# Patient Record
Sex: Male | Born: 1985
Health system: Southern US, Community
[De-identification: ages and names within clinical notes are randomized; demographics above are authoritative.]

## PROBLEM LIST (undated history)

## (undated) HISTORY — PX: WISDOM TOOTH EXTRACTION: SHX21

---

## 2010-04-19 ENCOUNTER — Emergency Department (HOSPITAL_COMMUNITY): Admission: EM | Admit: 2010-04-19 | Discharge: 2010-04-19 | Payer: Self-pay | Admitting: Family Medicine

## 2010-04-26 ENCOUNTER — Emergency Department (HOSPITAL_COMMUNITY): Admission: EM | Admit: 2010-04-26 | Discharge: 2010-04-26 | Payer: Self-pay | Admitting: Family Medicine

## 2011-04-03 ENCOUNTER — Inpatient Hospital Stay (HOSPITAL_COMMUNITY)
Admission: RE | Admit: 2011-04-03 | Discharge: 2011-04-03 | Disposition: A | Discharge status: Home / Self Care | Payer: Self-pay | Source: Ambulatory Visit | Attending: Family Medicine | Admitting: Family Medicine

## 2011-06-20 ENCOUNTER — Inpatient Hospital Stay (INDEPENDENT_AMBULATORY_CARE_PROVIDER_SITE_OTHER)
Admission: RE | Admit: 2011-06-20 | Discharge: 2011-06-20 | Disposition: A | Discharge status: Home / Self Care | Payer: Self-pay | Source: Ambulatory Visit | Attending: Emergency Medicine | Admitting: Emergency Medicine

## 2011-06-20 DIAGNOSIS — R112 Nausea with vomiting, unspecified: Secondary | ICD-10-CM

## 2012-03-01 ENCOUNTER — Encounter (HOSPITAL_COMMUNITY): Payer: Self-pay | Admitting: Emergency Medicine

## 2012-03-01 ENCOUNTER — Emergency Department (HOSPITAL_COMMUNITY)
Admission: EM | Admit: 2012-03-01 | Discharge: 2012-03-01 | Disposition: A | Discharge status: Home / Self Care | Payer: BC Managed Care – PPO | Attending: Emergency Medicine | Admitting: Emergency Medicine

## 2012-03-01 DIAGNOSIS — F17200 Nicotine dependence, unspecified, uncomplicated: Secondary | ICD-10-CM | POA: Insufficient documentation

## 2012-03-01 DIAGNOSIS — M545 Low back pain, unspecified: Secondary | ICD-10-CM | POA: Insufficient documentation

## 2012-03-01 DIAGNOSIS — M533 Sacrococcygeal disorders, not elsewhere classified: Secondary | ICD-10-CM | POA: Insufficient documentation

## 2012-03-01 NOTE — ED Provider Notes (Signed)
History   This chart was scribed for Hurman Horn, MD by Melba Coon. The patient was seen in room APA09/APA09 and the patient's care was started at 7:18AM.    CSN: [id number]  Arrival date & time 03/01/12  4098   None     Chief Complaint  Patient presents with  . Back Pain    (Consider location/radiation/quality/duration/timing/severity/associated sxs/prior treatment) HPI Jose Zuniga is a 26 y.o. male who presents to the Emergency Department complaining of constant, dull, non-radiating, moderate to severe lower back pain with an onset this morning. Pt woke up this morning with a huge "egg-shaped" lump; pt states that the pain was so severe that "it was taking the breath out of me". Pt was fine yesterday night. Pt was non-ambulatory PTA but now walking and pain much better. 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 No weakness or numbness. No HA, Cp, neck pain, abd pain, n/v/d, or extremity pain or edema. No family hx of inflammotroty arthritis. No Hx of abscesses. No known allergies. No other pertinent medical problems. Pt is a non-smoker.No IVDA.  History reviewed. No pertinent past medical history.  Past Surgical History  Procedure Date  . Wisdom tooth extraction     No family history on file.  History  Substance Use Topics  . Smoking status: Current Everyday Smoker -- 1.0 packs/day for 5 years    Types: Cigarettes  . Smokeless tobacco: Not on file  . Alcohol Use: Yes     occasional      Review of Systems 10 Systems reviewed and are negative for acute change except as noted in the HPI.  Allergies  Review of patient's allergies indicates no known allergies.  Home Medications   Current Outpatient Rx  Name Route Sig Dispense Refill  . BUPRENORPHINE HCL-NALOXONE HCL 8-2 MG SL SUBL Sublingual Place under the tongue.      BP 117/71  Pulse 74  Temp(Src) 98.3 F (36.8 C) (Oral)  Ht 6\' 4"  (1.93  m)  Wt 179 lb (81.194 kg)  BMI 21.79 kg/m2  SpO2 100%  Physical Exam  Nursing note and vitals reviewed. Constitutional: He appears well-developed and well-nourished. No distress.       Awake, alert, nontoxic appearance with baseline speech.  HENT:  Head: Normocephalic and atraumatic.  Eyes: Conjunctivae and EOM are normal. Pupils are equal, round, and reactive to light. Right eye exhibits no discharge. Left eye exhibits no discharge.  Neck: Normal range of motion. Neck supple.  Cardiovascular: Normal rate, regular rhythm and normal heart sounds.   No murmur heard. Pulmonary/Chest: Effort normal and breath sounds normal. No respiratory distress. He has no wheezes. He has no rales. He exhibits no tenderness.  Abdominal: Soft. Bowel sounds are normal. He exhibits no mass. There is no tenderness. There is no rebound.  Musculoskeletal:       Thoracic back: He exhibits no tenderness.       Lumbar back: He exhibits no tenderness.       SI joint tenderness. Bilateral lower extremities non tender without new rashes or color change, baseline ROM with intact DP / PT pulses, CR<2 secs all digits bilaterally, sensation baseline light touch bilaterally for pt, DTR's symmetric and intact bilaterally KJ / AJ, motor symmetric bilateral 5 / 5 hip flexion, quadriceps, hamstrings, EHL, foot dorsiflexion, foot plantarflexion, gait somewhat antalgic but without apparent new ataxia.  Neurological:  Mental status baseline for patient.  Upper extremity motor strength and sensation intact and symmetric bilaterally.  Skin: Skin is warm and dry. No rash noted.       2 cm red, triangle-shaped superficial abrasion around the SI joint. Capillary refill < 2 sec.  Psychiatric: He has a normal mood and affect. His behavior is normal.    ED Course  Procedures (including critical care time)  Labs Reviewed - No data to display No results found.   1. Pain of left sacroiliac joint       MDM  I personally  performed the services described in this documentation, which was scribed in my presence. The recorded information has been reviewed and considered. I doubt any other EMC precluding discharge at this time including, but not necessarily limited to the following:SBI, cauda equina.    Hurman Horn, MD 03/01/12 2124

## 2012-03-01 NOTE — ED Notes (Signed)
Awoke with severe pain left buttocks - not as painful now if he does not move.  Red rash ? Abrasion noted on left buttocks.  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

## 2012-03-01 NOTE — Discharge Instructions (Signed)
SEEK IMMEDIATE MEDICAL ATTENTION IF: You develop fever redness swelling drainage or other concerns from the painful area or new pain areas. New numbness, tingling, weakness, or problem with the use of your arms or legs.  Severe back pain not relieved with medications.  Change in bowel or bladder control.  Increasing pain in any areas of the body (such as chest or abdominal pain).  Shortness of breath, dizziness or fainting.  Nausea (feeling sick to your stomach), vomiting, fever, or sweats.

## 2014-05-24 ENCOUNTER — Emergency Department (HOSPITAL_COMMUNITY)
Admission: EM | Admit: 2014-05-24 | Discharge: 2014-05-25 | Disposition: A | Discharge status: Home / Self Care | Payer: BC Managed Care – PPO | Attending: Emergency Medicine | Admitting: Emergency Medicine

## 2014-05-24 ENCOUNTER — Encounter (HOSPITAL_COMMUNITY): Payer: Self-pay | Admitting: Emergency Medicine

## 2014-05-24 ENCOUNTER — Emergency Department (HOSPITAL_COMMUNITY): Payer: BC Managed Care – PPO

## 2014-05-24 DIAGNOSIS — Z791 Long term (current) use of non-steroidal anti-inflammatories (NSAID): Secondary | ICD-10-CM | POA: Insufficient documentation

## 2014-05-24 DIAGNOSIS — S61219A Laceration without foreign body of unspecified finger without damage to nail, initial encounter: Secondary | ICD-10-CM

## 2014-05-24 DIAGNOSIS — W268XXA Contact with other sharp object(s), not elsewhere classified, initial encounter: Secondary | ICD-10-CM | POA: Insufficient documentation

## 2014-05-24 DIAGNOSIS — IMO0002 Reserved for concepts with insufficient information to code with codable children: Secondary | ICD-10-CM

## 2014-05-24 DIAGNOSIS — Y9289 Other specified places as the place of occurrence of the external cause: Secondary | ICD-10-CM | POA: Insufficient documentation

## 2014-05-24 DIAGNOSIS — F17200 Nicotine dependence, unspecified, uncomplicated: Secondary | ICD-10-CM | POA: Insufficient documentation

## 2014-05-24 DIAGNOSIS — S61409A Unspecified open wound of unspecified hand, initial encounter: Secondary | ICD-10-CM | POA: Insufficient documentation

## 2014-05-24 DIAGNOSIS — Y9389 Activity, other specified: Secondary | ICD-10-CM | POA: Insufficient documentation

## 2014-05-24 DIAGNOSIS — Z79899 Other long term (current) drug therapy: Secondary | ICD-10-CM | POA: Insufficient documentation

## 2014-05-24 DIAGNOSIS — S61209A Unspecified open wound of unspecified finger without damage to nail, initial encounter: Secondary | ICD-10-CM | POA: Insufficient documentation

## 2014-05-24 DIAGNOSIS — S61419A Laceration without foreign body of unspecified hand, initial encounter: Secondary | ICD-10-CM

## 2014-05-24 MED ORDER — BUPIVACAINE HCL (PF) 0.5 % IJ SOLN
INTRAMUSCULAR | Status: AC
  Administered 2014-05-25
  Filled 2014-05-24: qty 30

## 2014-05-24 MED ORDER — LIDOCAINE HCL (PF) 2 % IJ SOLN
INTRAMUSCULAR | Status: AC
  Filled 2014-05-24: qty 10

## 2014-05-24 MED ORDER — POVIDONE-IODINE 10 % EX SOLN
CUTANEOUS | Status: AC
  Administered 2014-05-25
  Filled 2014-05-24: qty 118

## 2014-05-24 NOTE — ED Notes (Signed)
Pt also has mulitple abrasions and cuts to both hands.

## 2014-05-24 NOTE — ED Notes (Signed)
Lido changed to marcaine. Lido wasted, witnessed by L. Shana Chute, Charity fundraiser.

## 2014-05-24 NOTE — ED Notes (Addendum)
Pt was out in his work shop and a pice of wood kicked backed at [employer]. Pt has laceration to right middle finger and in between his thumb and pointer finger. Pt very upset and anxious. Pt can only have advil/ibuprofen. Pt is on Suboxone.

## 2014-05-25 MED ORDER — HYDROGEN PEROXIDE 3 % EX SOLN
CUTANEOUS | Status: AC
  Administered 2014-05-25
  Filled 2014-05-25: qty 473

## 2014-05-25 NOTE — ED Provider Notes (Signed)
CSN: [id number]     Arrival date & time 05/24/14  2124 History   First MD Initiated Contact with Patient 05/24/14 2255     Chief Complaint  Patient presents with  . Extremity Laceration     (Consider location/radiation/quality/duration/timing/severity/associated sxs/prior Treatment) HPI Comments: Jose Zuniga is a 28 y.o. Male presenting with lacerations to his right distal long finger and his left hand which occurred when a piece of hard unfinished cherry wood kicked back into his hands as he was sawing the wood.  He reports pain without numbness at the sites of his injuries.  He reports the wounds bled copiously but has resolved with pressure.  He is utd with his tetanus immunizations.     The history is provided by the patient.    History reviewed. No pertinent past medical history. Past Surgical History  Procedure Laterality Date  . Wisdom tooth extraction     No family history on file. History  Substance Use Topics  . Smoking status: Current Every Day Smoker -- 1.00 packs/day for 5 years    Types: Cigarettes  . Smokeless tobacco: Not on file  . Alcohol Use: Yes     Comment: occasional    Review of Systems  Constitutional: Negative for fever and chills.  Respiratory: Negative for shortness of breath and wheezing.   Musculoskeletal: Positive for arthralgias.  Skin: Positive for wound.  Neurological: Negative for numbness.      Allergies  Review of patient's allergies indicates no known allergies.  Home Medications   Prior to Admission medications   Medication Sig Start Date End Date Taking? Authorizing Provider  buprenorphine-naloxone (SUBOXONE) 2-0.5 MG SUBL SL tablet Place 1 tablet under the tongue 2 (two) times daily.   Yes Historical Provider, MD  ibuprofen (ADVIL,MOTRIN) 200 MG tablet Take 200-600 mg by mouth daily as needed for mild pain or moderate pain.   Yes Historical Provider, MD  sertraline (ZOLOFT) 50 MG tablet Take 50 mg by mouth daily.   Yes  Historical Provider, MD   BP 128/111  Pulse 105  Temp(Src) 97.6 F (36.4 C) (Oral)  Resp 24  Ht 6\' 4"  (1.93 m)  Wt 176 lb (79.833 kg)  BMI 21.43 kg/m2  SpO2 100% Physical Exam  Constitutional: He is oriented to person, place, and time. He appears well-developed and well-nourished.  HENT:  Head: Normocephalic.  Cardiovascular: Normal rate.   Pulmonary/Chest: Effort normal.  Musculoskeletal: He exhibits tenderness.  Neurological: He is alert and oriented to person, place, and time. No sensory deficit.  Skin: Laceration noted.  2 cm deep flap laceration right distal long finger not involving the nail plate.  Hemostatic.  Irregular shaped 2 cm laceration left hand palm in web space between thumb and index finger,  Both wounds are hemostatic.      ED Course  Procedures (including critical care time)   LACERATION REPAIR Performed by: Burgess AmorJulie Idy Rawling Authorized by: Burgess AmorJulie Renel Ende Consent: Verbal consent obtained. Risks and benefits: risks, benefits and alternatives were discussed Consent given by: patient Patient identity confirmed: provided demographic data Prepped and Draped in normal sterile fashion Wound explored  Laceration Location: left volar hand  Laceration Length: 2 cm  No Foreign Bodies seen or palpated  Anesthesia: local infiltration  Local anesthetic:  0.5% marcaine without epinephrine  Anesthetic total: 2 ml  Irrigation method: scrub with betadine and 4 x 4's,  Followed by copious flushing with NS Amount of cleaning: copious  Skin closure: ethilon 4-0  Number of sutures: 4  Technique: simple interrupted  Patient tolerance: Patient tolerated the procedure well with no immediate complications.   LACERATION REPAIR Performed by: Burgess Amor Authorized by: Burgess Amor Consent: Verbal consent obtained. Risks and benefits: risks, benefits and alternatives were discussed Consent given by: patient Patient identity confirmed: provided demographic data Prepped and  Draped in normal sterile fashion Wound explored  Laceration Location: right distal long finger  Laceration Length: 2cm  No Foreign Bodies seen or palpated  Anesthesia: digital block  Local anesthetic: marcaine 0.5% without epi Anesthetic total: 1.5 ml  Irrigation method: syringe Amount of cleaning: standard  Skin closure: ethilon 4-0  Number of sutures: 6  Technique: simple interupted  Patient tolerance: Patient tolerated the procedure well with no immediate complications.   Labs Review Labs Reviewed - No data to display  Imaging Review Dg Hand Complete Left  05/25/2014   CLINICAL DATA:  Extremity laceration  EXAM: LEFT HAND - COMPLETE 3+ VIEW  COMPARISON:  None.  FINDINGS: No acute fracture or malalignment. Tiny superficial metallic density foreign body at the laceration. Healed third digit tuft fracture.  IMPRESSION: 1. No acute osseous abnormality. 2. Punctate superficial debris in the region of laceration.   Electronically Signed   By: Tiburcio Pea M.D.   On: 05/25/2014 00:22   Dg Hand Complete Right  05/25/2014   CLINICAL DATA:  Extremity laceration  EXAM: RIGHT HAND - COMPLETE 3+ VIEW  COMPARISON:  None.  FINDINGS: Soft tissue irregularity to the pad of the long finger. There is no fracture or radiopaque foreign body.  IMPRESSION: No osseous abnormality or radiopaque foreign body.   Electronically Signed   By: Tiburcio Pea M.D.   On: 05/25/2014 00:23     EKG Interpretation None      MDM   Final diagnoses:  Laceration of multiple sites of hand and fingers  Laceration    Sutured repair of lacerations of hands.  Pt encouraged keep wounds clean and dry.  Check twice daily for signs of infection.  Suture removal in 10 days.  Recheck sooner for any signs of infection.    Burgess Amor, PA-C 05/25/14 [phone]

## 2014-05-25 NOTE — Discharge Instructions (Signed)
Laceration Care, Adult °A laceration is a cut or lesion that goes through all layers of the skin and into the tissue just beneath the skin. °TREATMENT  °Some lacerations may not require closure. Some lacerations may not be able to be closed due to an increased risk of infection. It is important to see your caregiver as soon as possible after an injury to minimize the risk of infection and maximize the opportunity for successful closure. °If closure is appropriate, pain medicines may be given, if needed. The wound will be cleaned to help prevent infection. Your caregiver will use stitches (sutures), staples, wound glue (adhesive), or skin adhesive strips to repair the laceration. These tools bring the skin edges together to allow for faster healing and a better cosmetic outcome. However, all wounds will heal with a scar. Once the wound has healed, scarring can be minimized by covering the wound with sunscreen during the day for 1 full year. °HOME CARE INSTRUCTIONS  °For sutures or staples: °· Keep the wound clean and dry. °· If you were given a bandage (dressing), you should change it at least once a day. Also, change the dressing if it becomes wet or dirty, or as directed by your caregiver. °· Wash the wound with soap and water 2 times a day. Rinse the wound off with water to remove all soap. Pat the wound dry with a clean towel. °· After cleaning, apply a thin layer of the antibiotic ointment as recommended by your caregiver. This will help prevent infection and keep the dressing from sticking. °· You may shower as usual after the first 24 hours. Do not soak the wound in water until the sutures are removed. °· Only take over-the-counter or prescription medicines for pain, discomfort, or fever as directed by your caregiver. °· Get your sutures or staples removed as directed by your caregiver. °For skin adhesive strips: °· Keep the wound clean and dry. °· Do not get the skin adhesive strips wet. You may bathe  carefully, using caution to keep the wound dry. °· If the wound gets wet, pat it dry with a clean towel. °· Skin adhesive strips will fall off on their own. You may trim the strips as the wound heals. Do not remove skin adhesive strips that are still stuck to the wound. They will fall off in time. °For wound adhesive: °· You may briefly wet your wound in the shower or bath. Do not soak or scrub the wound. Do not swim. Avoid periods of heavy perspiration until the skin adhesive has fallen off on its own. After showering or bathing, gently pat the wound dry with a clean towel. °· Do not apply liquid medicine, cream medicine, or ointment medicine to your wound while the skin adhesive is in place. This may loosen the film before your wound is healed. °· If a dressing is placed over the wound, be careful not to apply tape directly over the skin adhesive. This may cause the adhesive to be pulled off before the wound is healed. °· Avoid prolonged exposure to sunlight or tanning lamps while the skin adhesive is in place. Exposure to ultraviolet light in the first year will darken the scar. °· The skin adhesive will usually remain in place for 5 to 10 days, then naturally fall off the skin. Do not pick at the adhesive film. °You may need a tetanus shot if: °· You cannot remember when you had your last tetanus shot. °· You have never had a tetanus   shot. If you get a tetanus shot, your arm may swell, get red, and feel warm to the touch. This is common and not a problem. If you need a tetanus shot and you choose not to have one, there is a rare chance of getting tetanus. Sickness from tetanus can be serious. SEEK MEDICAL CARE IF:   You have redness, swelling, or increasing pain in the wound.  You see a red line that goes away from the wound.  You have yellowish-white fluid (pus) coming from the wound.  You have a fever.  You notice a bad smell coming from the wound or dressing.  Your wound breaks open before or  after sutures have been removed.  You notice something coming out of the wound such as wood or glass.  Your wound is on your hand or foot and you cannot move a finger or toe. SEEK IMMEDIATE MEDICAL CARE IF:   You have severe swelling around the wound causing pain and numbness or a change in color in your arm, hand, leg, or foot.  Your wound splits open and starts bleeding.  You have worsening numbness, weakness, or loss of function of any joint around or beyond the wound.  You develop painful lumps near the wound or on the skin anywhere on your body. MAKE SURE YOU:   Understand these instructions.  Will watch your condition.  Will get help right away if you are not doing well or get worse. Document Released: 12/10/2005 Document Revised: 03/03/2012 Document Reviewed: 06/05/2011 [facility] Patient Information 2014 [place], [place].

## 2014-05-25 NOTE — ED Notes (Signed)
Julie Idol, PA at bedside 

## 2014-05-27 NOTE — ED Provider Notes (Signed)
Medical screening examination/treatment/procedure(s) were performed by non-physician practitioner and as supervising physician I was immediately available for consultation/collaboration.   EKG Interpretation None       Shacarra Choe M Reza Crymes, MD 05/27/14 1725 

## 2015-08-02 IMAGING — CR DG HAND COMPLETE 3+V*R*
3 series · 3 of 3 positions shown · non-contrast
Comparison: None.

CLINICAL DATA: Extremity laceration

EXAM:
RIGHT HAND - COMPLETE 3+ VIEW

[view not recorded (1 of 3)]
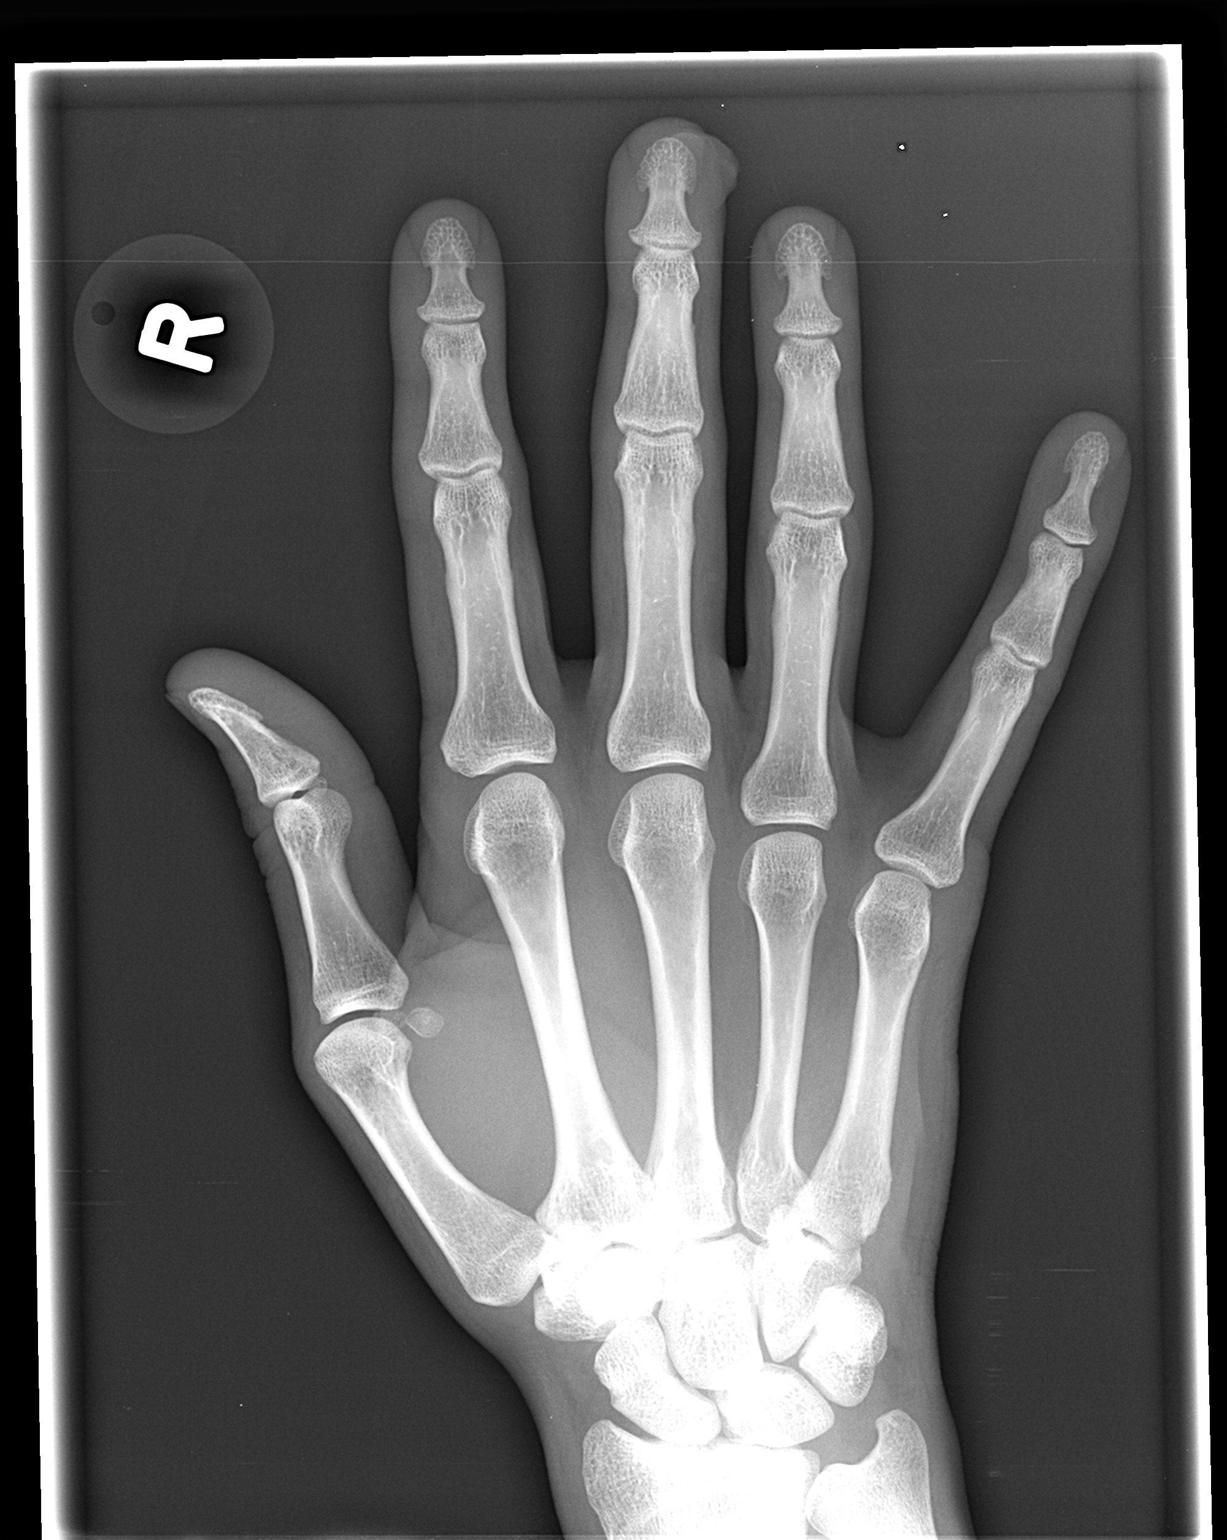

[view not recorded (2 of 3)]
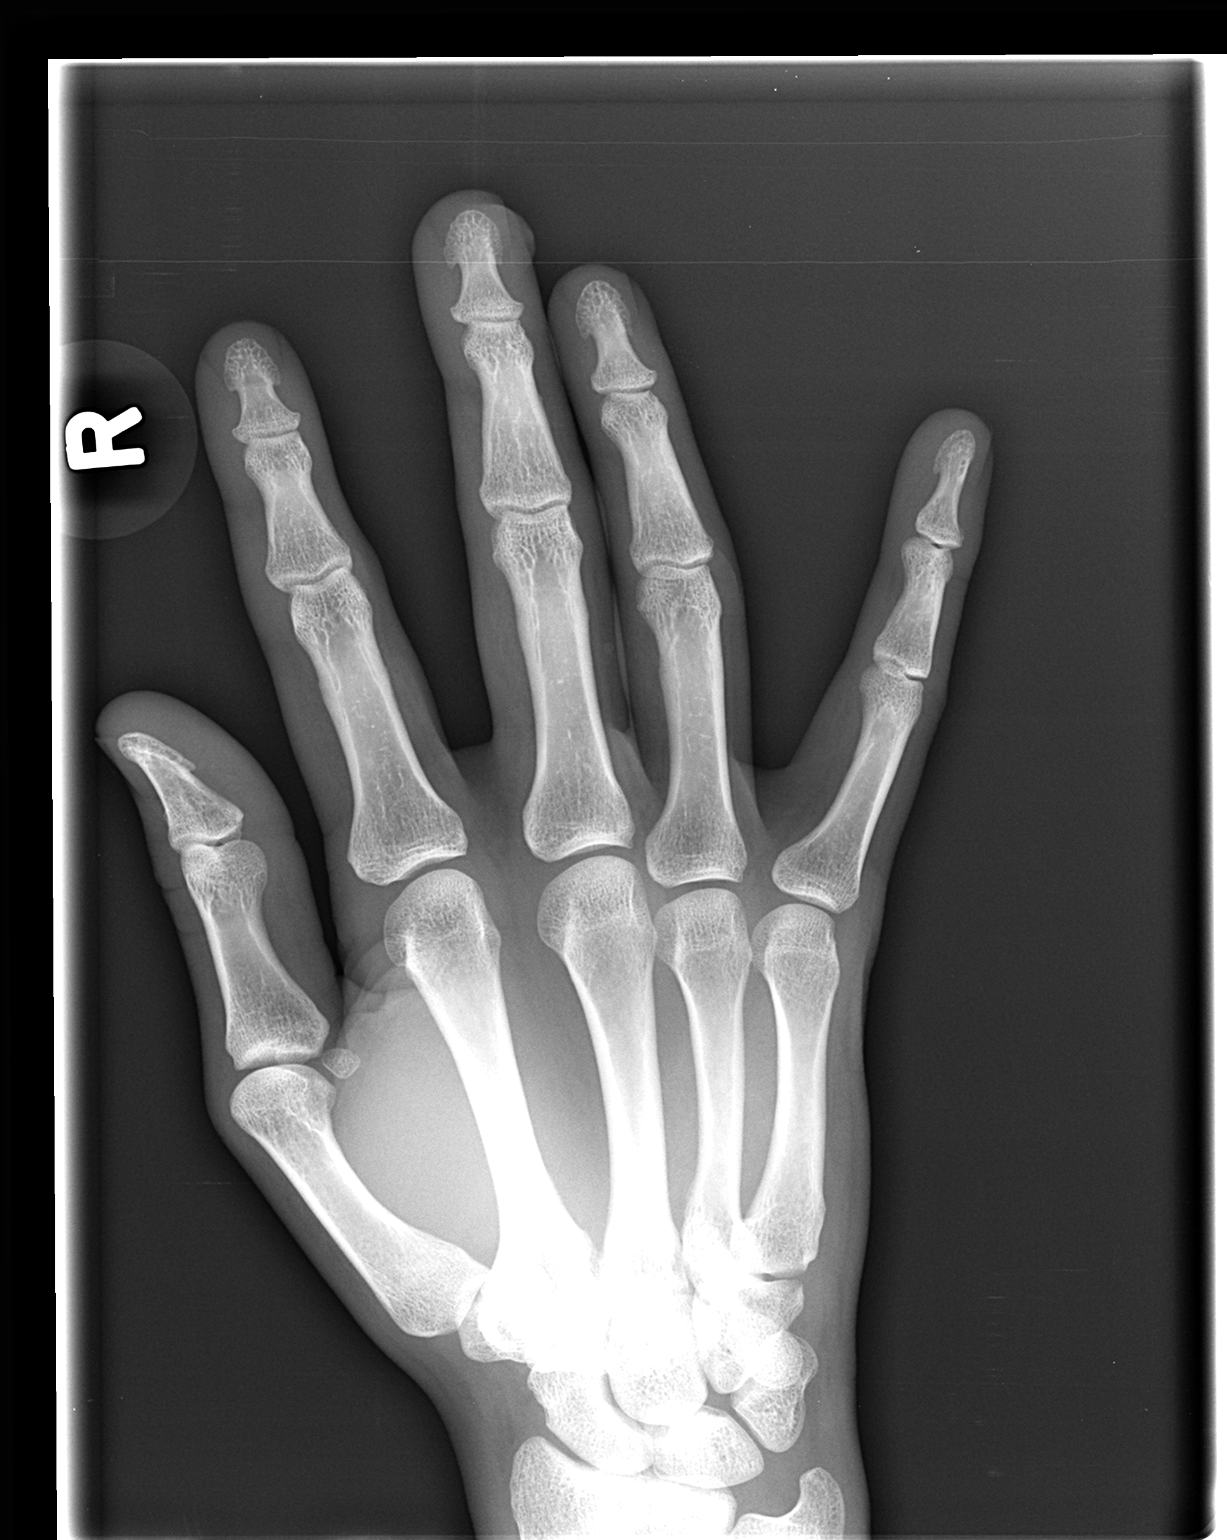

[view not recorded (3 of 3)]
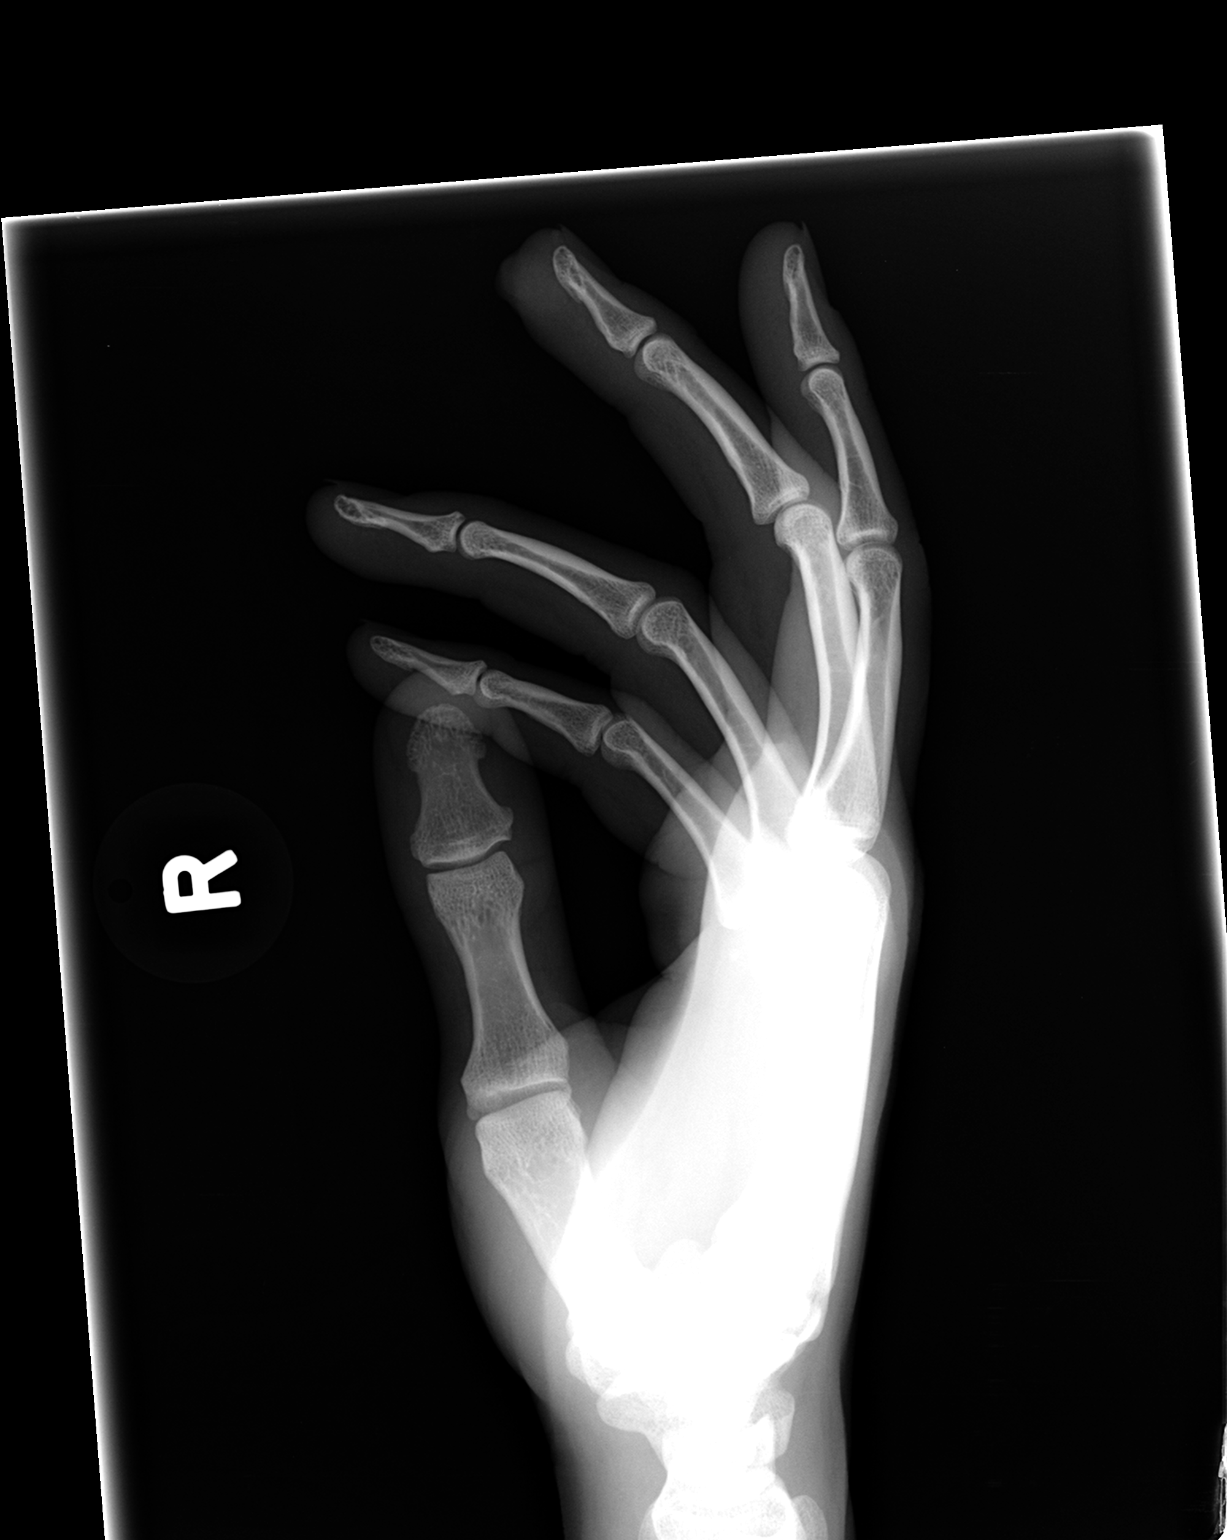

[3 of 3 positions shown; findings below may reference images not displayed]

FINDINGS: Soft tissue irregularity to the pad of the long finger. There is no
fracture or radiopaque foreign body.
IMPRESSION: No osseous abnormality or radiopaque foreign body.

## 2015-08-02 IMAGING — CR DG HAND COMPLETE 3+V*L*
3 series · 3 of 3 positions shown · non-contrast
Comparison: None.

CLINICAL DATA: Extremity laceration

EXAM:
LEFT HAND - COMPLETE 3+ VIEW

[view not recorded (1 of 3)]
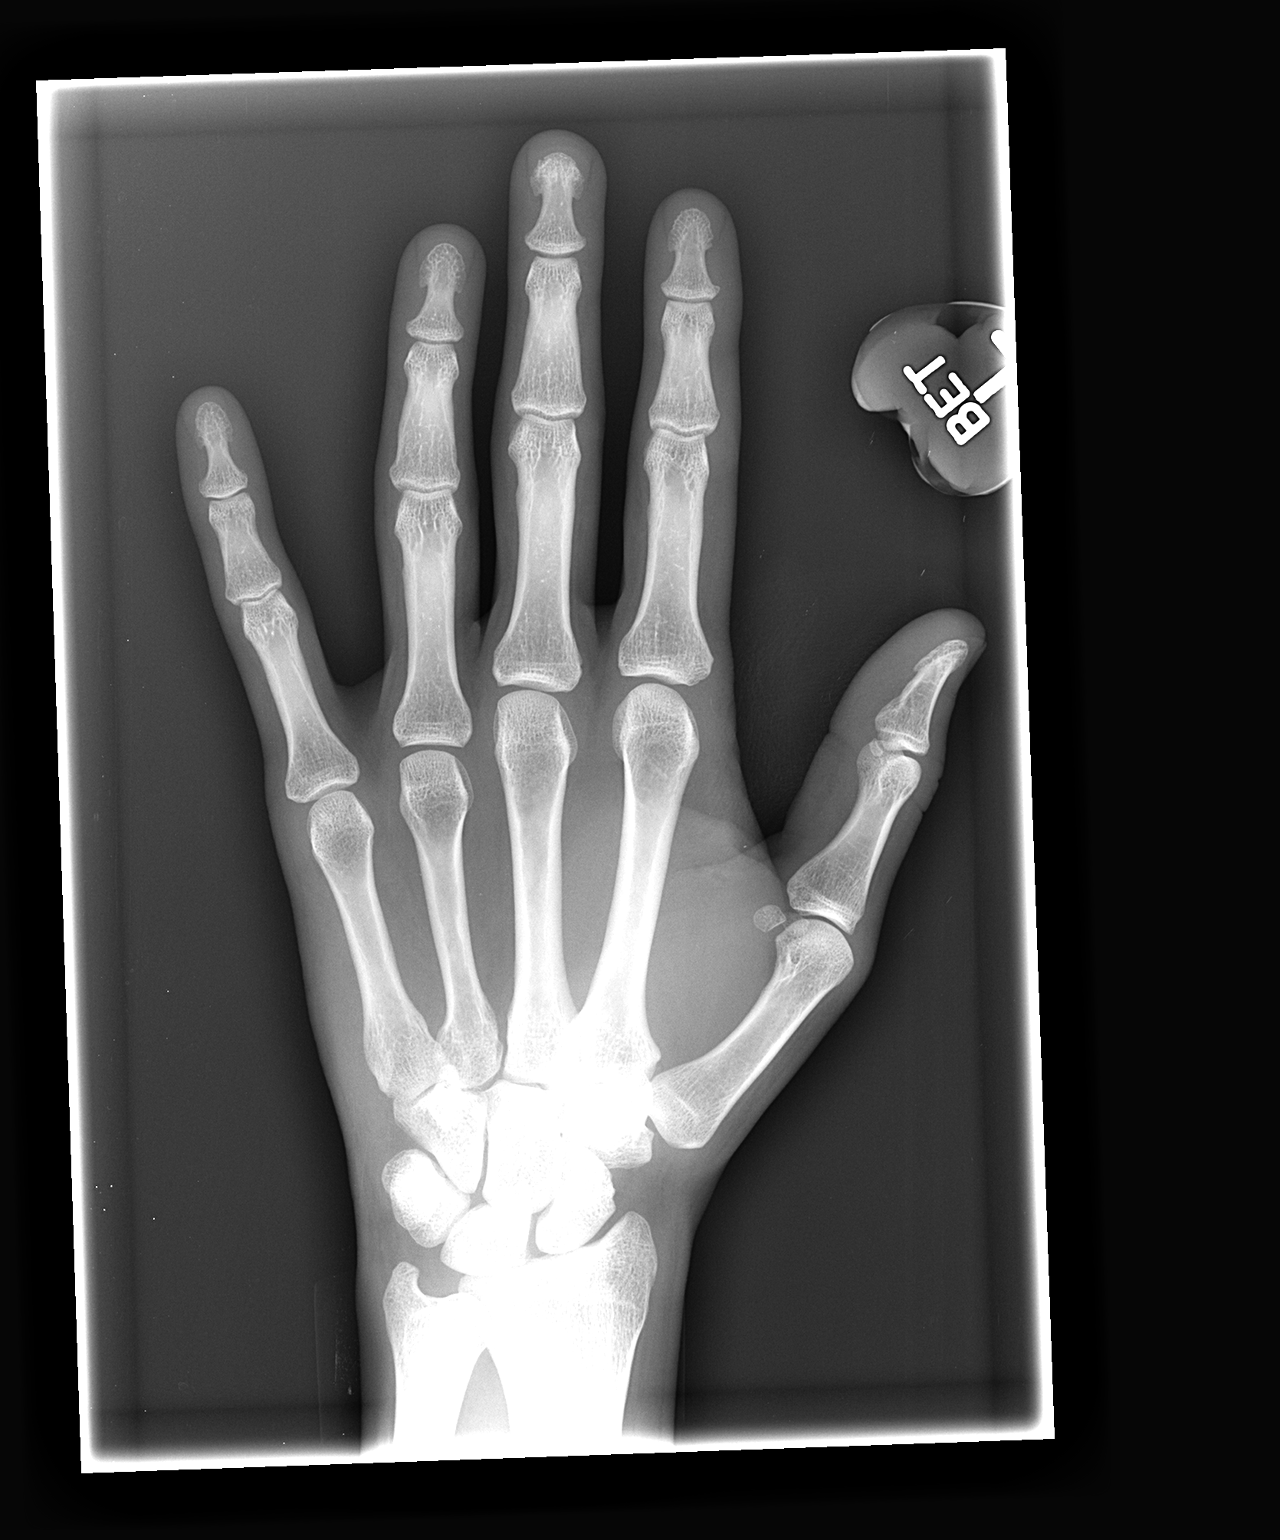

[view not recorded (2 of 3)]
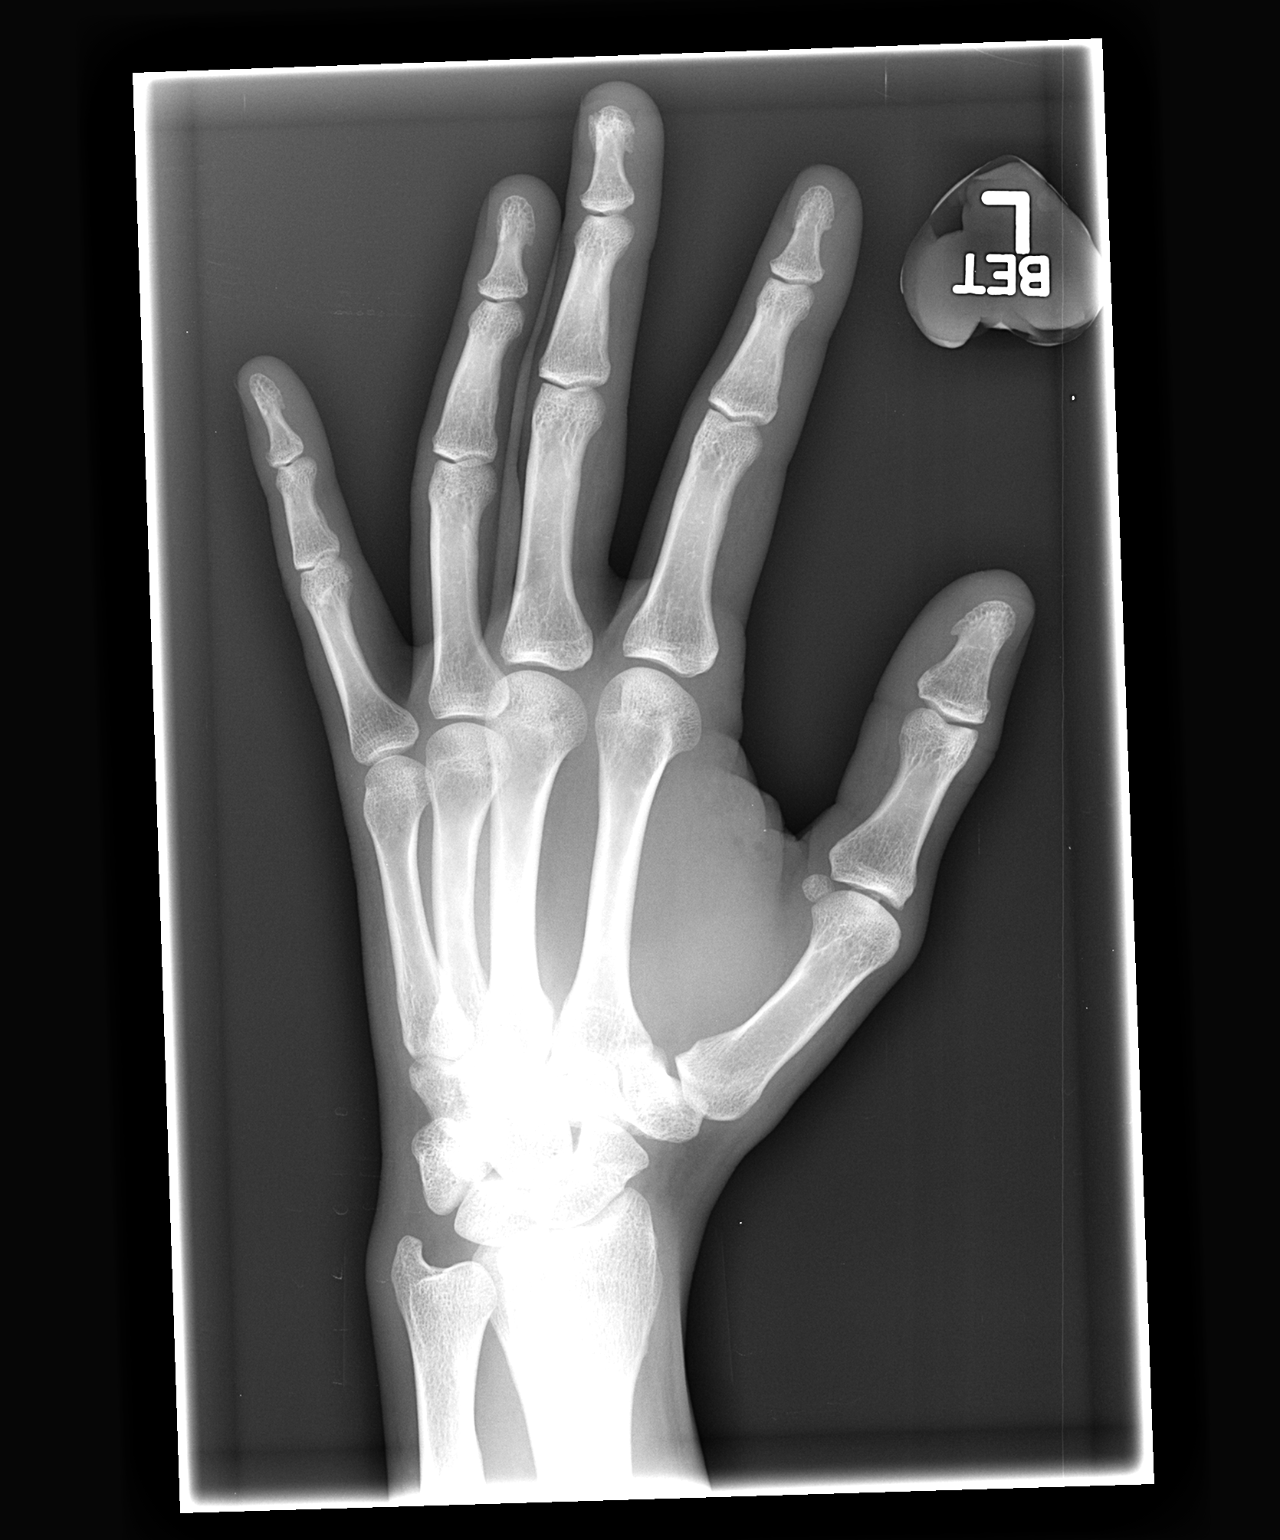

[view not recorded (3 of 3)]
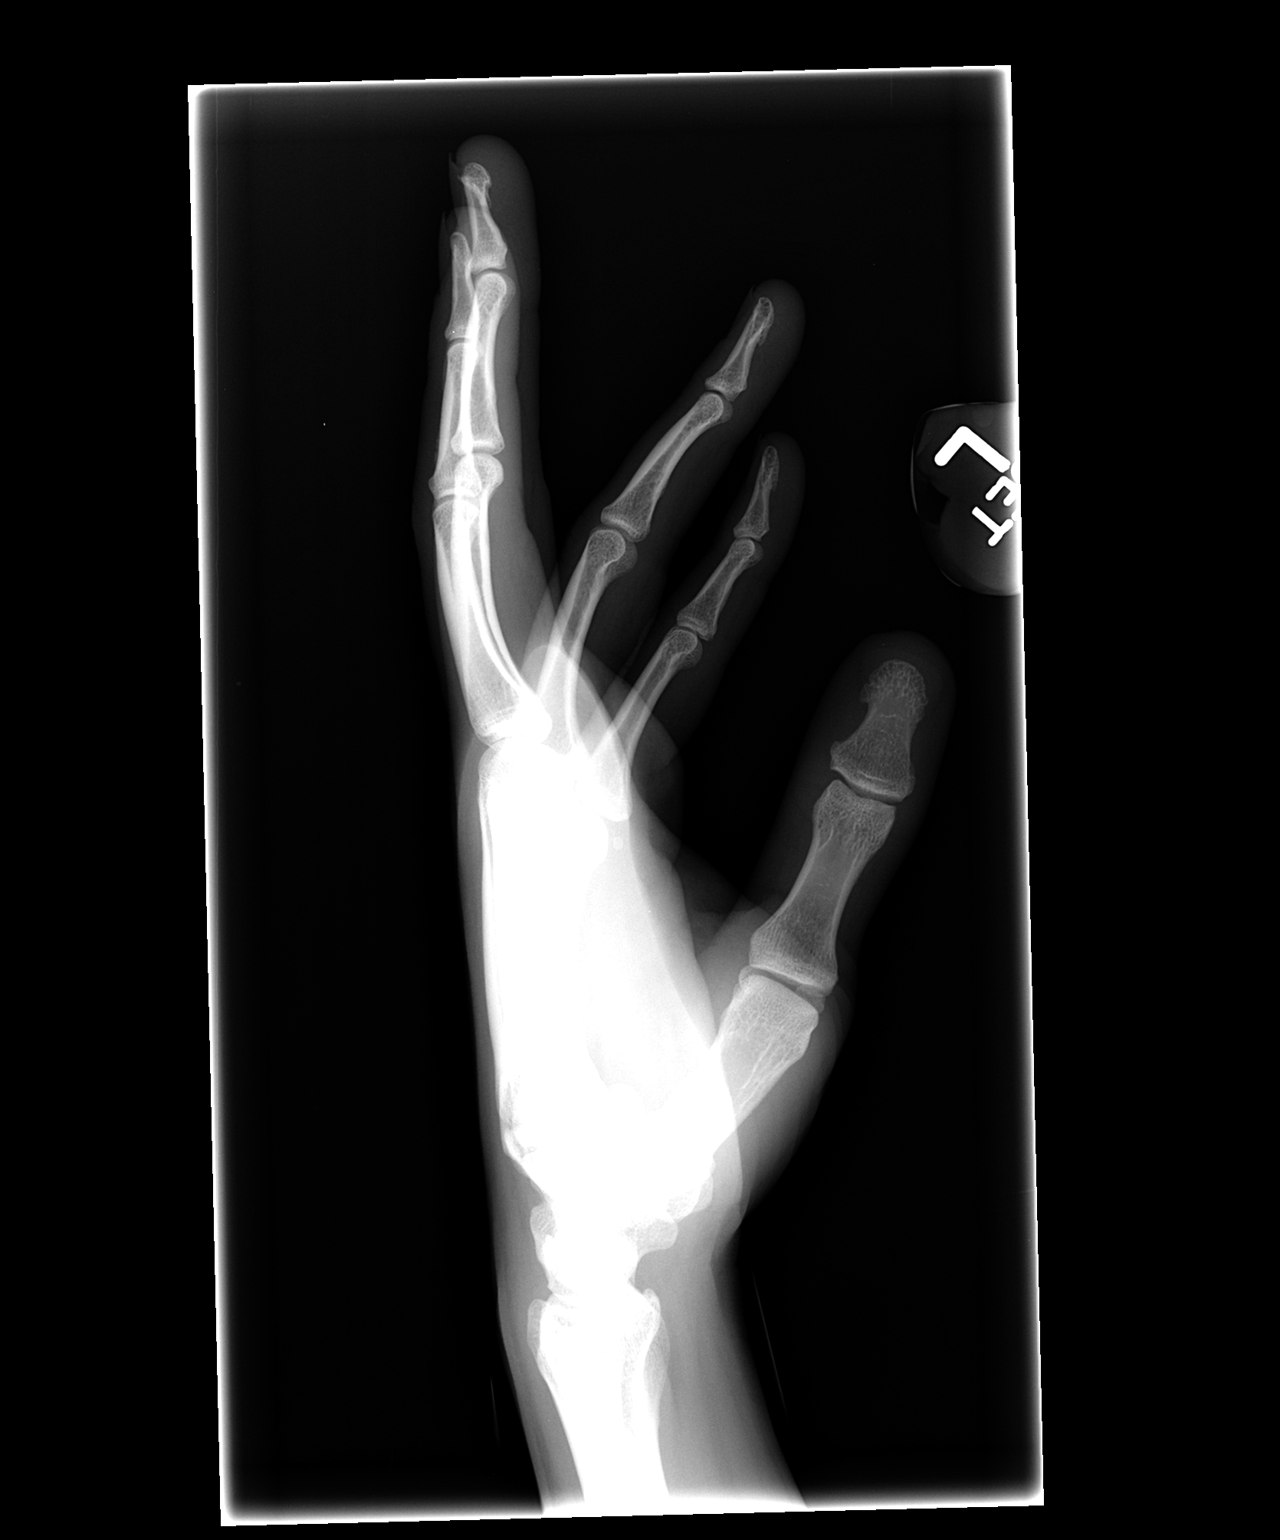

[3 of 3 positions shown; findings below may reference images not displayed]

FINDINGS: No acute fracture or malalignment. Tiny superficial metallic density
foreign body at the laceration. Healed third digit tuft fracture.
IMPRESSION: 1. No acute osseous abnormality.
2. Punctate superficial debris in the region of laceration.

## 2018-01-30 ENCOUNTER — Ambulatory Visit: Payer: Self-pay | Admitting: Urgent Care

## 2018-04-03 DIAGNOSIS — F4312 Post-traumatic stress disorder, chronic: Secondary | ICD-10-CM | POA: Diagnosis not present

## 2018-04-04 DIAGNOSIS — F1120 Opioid dependence, uncomplicated: Secondary | ICD-10-CM | POA: Diagnosis not present

## 2018-04-19 DIAGNOSIS — Z1389 Encounter for screening for other disorder: Secondary | ICD-10-CM | POA: Diagnosis not present

## 2018-04-19 DIAGNOSIS — Z79899 Other long term (current) drug therapy: Secondary | ICD-10-CM | POA: Diagnosis not present

## 2018-05-02 DIAGNOSIS — F1120 Opioid dependence, uncomplicated: Secondary | ICD-10-CM | POA: Diagnosis not present

## 2018-05-21 ENCOUNTER — Ambulatory Visit (HOSPITAL_COMMUNITY)
Admission: EM | Admit: 2018-05-21 | Discharge: 2018-05-21 | Disposition: A | Discharge status: Home / Self Care | Payer: 59 | Attending: Family Medicine | Admitting: Family Medicine

## 2018-05-21 ENCOUNTER — Encounter (HOSPITAL_COMMUNITY): Payer: Self-pay | Admitting: Emergency Medicine

## 2018-05-21 DIAGNOSIS — Z79899 Other long term (current) drug therapy: Secondary | ICD-10-CM | POA: Diagnosis not present

## 2018-05-21 DIAGNOSIS — F17210 Nicotine dependence, cigarettes, uncomplicated: Secondary | ICD-10-CM | POA: Insufficient documentation

## 2018-05-21 DIAGNOSIS — J029 Acute pharyngitis, unspecified: Secondary | ICD-10-CM | POA: Insufficient documentation

## 2018-05-21 LAB — POCT RAPID STREP A: Streptococcus, Group A Screen (Direct): NEGATIVE

## 2018-05-21 MED ORDER — CETIRIZINE HCL 10 MG PO CAPS: 10.0000 mg | ORAL_CAPSULE | Freq: Every day | ORAL | 0 refills | Status: DC

## 2018-05-21 NOTE — ED Provider Notes (Signed)
[facility]    CSN: [id number] Arrival date & time: 05/21/18  1624     History   Chief Complaint Chief Complaint  Patient presents with  . Sore Throat    HPI Jose Zuniga is a 31 y.o. male no significant PMH, presenting today with a sore throat.  Symtpoms began this morning. Also had left ear pain. Denies associated cough, congestion and rhinnorhea. Took ibuprofen 800 mg, symptoms significantly improved, no pain now. Denies fever. Difficulty with oral intake earlier today.   HPI  History reviewed. No pertinent past medical history.  There are no active problems to display for this patient.   Past Surgical History:  Procedure Laterality Date  . WISDOM TOOTH EXTRACTION         Home Medications    Prior to Admission medications   Medication Sig Start Date End Date Taking? Authorizing Provider  buprenorphine-naloxone (SUBOXONE) 2-0.5 MG SUBL SL tablet Place 1 tablet under the tongue 2 (two) times daily.    [provider]  Cetirizine HCl 10 MG CAPS Take 1 capsule (10 mg total) by mouth daily. 05/21/18   Sharlyn Odonnel C, PA-C  ibuprofen (ADVIL,MOTRIN) 200 MG tablet Take 200-600 mg by mouth daily as needed for mild pain or moderate pain.    [provider]  sertraline (ZOLOFT) 50 MG tablet Take 50 mg by mouth daily.    [provider]    Family History History reviewed. No pertinent family history.  Social History Social History   Tobacco Use  . Smoking status: Current Every Day Smoker    Packs/day: 1.00    Years: 5.00    Pack years: 5.00    Types: Cigarettes  Substance Use Topics  . Alcohol use: Yes    Comment: occasional  . Drug use: No     Allergies   Patient has no known allergies.   Review of Systems Review of Systems  Constitutional: Negative for activity change, appetite change, fatigue and fever.  HENT: Positive for sore throat. Negative for congestion, ear pain, postnasal drip, rhinorrhea and sinus pressure.    Eyes: Negative for pain and itching.  Respiratory: Negative for cough and shortness of breath.   Cardiovascular: Negative for chest pain.  Gastrointestinal: Negative for abdominal pain, diarrhea, nausea and vomiting.  Musculoskeletal: Negative for myalgias.  Skin: Negative for rash.  Neurological: Negative for dizziness, light-headedness and headaches.     Physical Exam Triage Vital Signs ED Triage Vitals [05/21/18 1646]  Enc Vitals Group     BP 134/82     Pulse Rate 76     Resp 18     Temp 98.2 F (36.8 C)     Temp Source Oral     SpO2 100 %     Weight      Height      Head Circumference      Peak Flow      Pain Score      Pain Loc      Pain Edu?      Excl. in GC?    No data found.  Updated Vital Signs BP 134/82 (BP Location: Left Arm)   Pulse 76   Temp 98.2 F (36.8 C) (Oral)   Resp 18   SpO2 100%   Visual Acuity Right Eye Distance:   Left Eye Distance:   Bilateral Distance:    Right Eye Near:   Left Eye Near:    Bilateral Near:     Physical Exam  Constitutional: He appears well-developed and well-nourished.  HENT:  Head: Normocephalic and atraumatic.  Bilateral ears without tenderness to palpation of external auricle, tragus and mastoid, EAC's without erythema or swelling, TM's with good bony landmarks and cone of light. Non erythematous.  Oral mucosa pink and moist, no tonsillar enlargement or exudate. Posterior pharynx patent and erythematous with cobblestoning, concerning for post nasal drainage, no uvula deviation or swelling. Normal phonation.   Eyes: Conjunctivae are normal.  Neck: Neck supple.  Cardiovascular: Normal rate and regular rhythm.  No murmur heard. Pulmonary/Chest: Effort normal and breath sounds normal. No respiratory distress.  Breathing comfortably at rest, CTABL, no wheezing, rales or other adventitious sounds auscultated  Abdominal: Soft. There is no tenderness.  Musculoskeletal: He exhibits no edema.  Neurological: He is  alert.  Skin: Skin is warm and dry.  Psychiatric: He has a normal mood and affect.  Nursing note and vitals reviewed.    UC Treatments / Results  Labs (all labs ordered are listed, but only abnormal results are displayed) Labs Reviewed  CULTURE, GROUP A STREP [facility])  POCT RAPID STREP A    EKG None  Radiology No results found.  Procedures Procedures (including critical care time)  Medications Ordered in UC Medications - No data to display  Initial Impression / Assessment and Plan / UC Course  I have reviewed the triage vital signs and the nursing notes.  Pertinent labs & imaging results that were available during my care of the patient were reviewed by me and considered in my medical decision making (see chart for details).     Patient tested negative for strep. No evidence of peritonsillar abscess or retropharyngeal abscess. Patient is nontoxic appearing, no drooling, dysphagia, muffled voice, or tripoding. No trismus. Recommended symptomatic management below. Likely viral etiology vs. Post nasal drip.   Final Clinical Impressions(s) / UC Diagnoses   Final diagnoses:  Sore throat     Discharge Instructions     Sore Throat  Your rapid strep tested Negative today. We will send for a culture and call in about 2 days if results are positive. For now we will treat your sore throat as a virus with symptom management.   Please continue Tylenol or Ibuprofen for fever and pain. May try salt water gargles, cepacol lozenges, throat spray, or OTC cold relief medicine for throat discomfort. If you also have congestion take a daily anti-histamine like Zyrtec, Claritin, and a oral decongestant to help with post nasal drip that may be irritating your throat.   Stay hydrated and drink plenty of fluids to keep your throat coated relieve irritation.    ED Prescriptions    Medication Sig Dispense Auth. Provider   Cetirizine HCl 10 MG CAPS Take 1 capsule (10 mg total) by mouth  daily. 30 capsule Brevin Mcfadden C, PA-C     Controlled Substance Prescriptions [place] Controlled Substance Registry consulted? Not Applicable   Lew Dawes, [place] 05/21/18 1715

## 2018-05-21 NOTE — ED Triage Notes (Signed)
Pt here for sore throat

## 2018-05-21 NOTE — Discharge Instructions (Addendum)

## 2018-05-24 LAB — CULTURE, GROUP A STREP (THRC)

## 2018-05-30 DIAGNOSIS — F1120 Opioid dependence, uncomplicated: Secondary | ICD-10-CM | POA: Diagnosis not present

## 2018-07-04 DIAGNOSIS — F1120 Opioid dependence, uncomplicated: Secondary | ICD-10-CM | POA: Diagnosis not present

## 2018-07-29 ENCOUNTER — Encounter (HOSPITAL_COMMUNITY): Payer: Self-pay

## 2018-07-29 ENCOUNTER — Ambulatory Visit (HOSPITAL_COMMUNITY)
Admission: EM | Admit: 2018-07-29 | Discharge: 2018-07-29 | Disposition: A | Discharge status: Home / Self Care | Payer: 59

## 2018-07-29 DIAGNOSIS — W57XXXA Bitten or stung by nonvenomous insect and other nonvenomous arthropods, initial encounter: Secondary | ICD-10-CM

## 2018-07-29 DIAGNOSIS — S50861A Insect bite (nonvenomous) of right forearm, initial encounter: Secondary | ICD-10-CM

## 2018-07-29 MED ORDER — METHYLPREDNISOLONE SODIUM SUCC 125 MG IJ SOLR
80.0000 mg | Freq: Once | INTRAMUSCULAR | Status: AC
  Administered 2018-07-29: 80 mg via INTRAMUSCULAR

## 2018-07-29 MED ORDER — METHYLPREDNISOLONE SODIUM SUCC 125 MG IJ SOLR
INTRAMUSCULAR | Status: AC
  Filled 2018-07-29: qty 2

## 2018-07-29 NOTE — ED Provider Notes (Signed)
[facility]    CSN: [id number] Arrival date & time: 07/29/18  1421     History   Chief Complaint Chief Complaint  Patient presents with  . Insect Bite    HPI Jose Zuniga is a 32 y.o. male.   Patient is a 32 year old male that presents with wasp sting to right antecubital area that occurred on Friday while he was at work.  He reports that since Friday it has gotten more swollen, painful with increased warmth and redness extending into his right forearm.  The swelling is worse after using his arm.  It was at its worse yesterday after he got off work.  He denies a history of reaction to an insect sting like this in the past.  He has been using ibuprofen for pain.  The swelling and pain has decreased since yesterday. He was called in a prescriptions for prednisone and antibiotics but hasn't started them yet. Denies any fever, chills, body aches, fatigue.  Denies any chest pain, SOB.   ROS per HPI   .     History reviewed. No pertinent past medical history.  There are no active problems to display for this patient.   Past Surgical History:  Procedure Laterality Date  . WISDOM TOOTH EXTRACTION         Home Medications    Prior to Admission medications   Medication Sig Start Date End Date Taking? Authorizing Provider  amphetamine-dextroamphetamine (ADDERALL XR) 10 MG 24 hr capsule Take 10 mg by mouth daily.   Yes [provider]  buprenorphine-naloxone (SUBOXONE) 2-0.5 MG SUBL SL tablet Place 1 tablet under the tongue 2 (two) times daily.   Yes [provider]  Cetirizine HCl 10 MG CAPS Take 1 capsule (10 mg total) by mouth daily. 05/21/18  Yes Wieters, Hallie C, PA-C  ibuprofen (ADVIL,MOTRIN) 200 MG tablet Take 200-600 mg by mouth daily as needed for mild pain or moderate pain.   Yes [provider]  sertraline (ZOLOFT) 50 MG tablet Take 50 mg by mouth daily.    [provider]    Family History No family history on  file.  Social History Social History   Tobacco Use  . Smoking status: Current Every Day Smoker    Packs/day: 1.00    Years: 5.00    Pack years: 5.00    Types: Cigarettes  Substance Use Topics  . Alcohol use: Yes    Comment: occasional  . Drug use: No     Allergies   Patient has no known allergies.   Review of Systems Review of Systems   Physical Exam Triage Vital Signs ED Triage Vitals [07/29/18 1500]  Enc Vitals Group     BP (!) 137/97     Pulse Rate (!) 102     Resp 18     Temp 98.2 F (36.8 C)     Temp src      SpO2 100 %     Weight      Height      Head Circumference      Peak Flow      Pain Score 0     Pain Loc      Pain Edu?      Excl. in GC?    No data found.  Updated Vital Signs BP (!) 137/97   Pulse (!) 102   Temp 98.2 F (36.8 C)   Resp 18   SpO2 100%   Visual Acuity Right Eye Distance:  Left Eye Distance:   Bilateral Distance:    Right Eye Near:   Left Eye Near:    Bilateral Near:     Physical Exam  Constitutional: He is oriented to person, place, and time. He appears well-developed and well-nourished.  HENT:  Head: Normocephalic and atraumatic.  Pulmonary/Chest: Effort normal.  Neurological: He is alert and oriented to person, place, and time.  Skin: Skin is warm and dry. Capillary refill takes less than 2 seconds.  Insect bite to right AC with swelling, erythema extending into forearm. Compared to picture of his arm yesterday the swelling and redness had significantly decreased. Distal pulse intact and sensation intact.   Psychiatric: He has a normal mood and affect.  Nursing note and vitals reviewed.    UC Treatments / Results  Labs (all labs ordered are listed, but only abnormal results are displayed) Labs Reviewed - No data to display  EKG None  Radiology No results found.  Procedures Procedures (including critical care time)  Medications Ordered in UC Medications  methylPREDNISolone sodium succinate  (SOLU-MEDROL) 125 mg/2 mL injection 80 mg (80 mg Intramuscular Given 07/29/18 1517)    Initial Impression / Assessment and Plan / UC Course  I have reviewed the triage vital signs and the nursing notes.  Pertinent labs & imaging results that were available during my care of the patient were reviewed by me and considered in my medical decision making (see chart for details).     Will give steroid injection in the clinic. He may start the prednisone that he was already prescribed. Hold off on the antibiotics for now and monitor for worsening symptoms. Pt agreed  Final Clinical Impressions(s) / UC Diagnoses   Final diagnoses:  Insect bite of right forearm, initial encounter     Discharge Instructions     It was nice meeting you!!  We will treat you with a steroid injection for the allergic reaction.  Start the prednisone that you were prescribed. Hold off on the antibiotics for now. If its not better or worse in the next few days start the antibiotics.     ED Prescriptions    None     Controlled Substance Prescriptions [place] Controlled Substance Registry consulted? Not Applicable   Janace Aris, NP 07/29/18 1527

## 2018-07-29 NOTE — ED Triage Notes (Signed)
Pt presents with complaints of bee sting on Friday to his right arm. Increased swelling and pain in the area.

## 2018-07-29 NOTE — Discharge Instructions (Addendum)
It was nice meeting you!!  We will treat you with a steroid injection for the allergic reaction.  Start the prednisone that you were prescribed. Hold off on the antibiotics for now. If its not better or worse in the next few days start the antibiotics.

## 2018-08-01 DIAGNOSIS — F1120 Opioid dependence, uncomplicated: Secondary | ICD-10-CM | POA: Diagnosis not present

## 2018-09-01 DIAGNOSIS — F1120 Opioid dependence, uncomplicated: Secondary | ICD-10-CM | POA: Diagnosis not present

## 2018-09-29 DIAGNOSIS — F1120 Opioid dependence, uncomplicated: Secondary | ICD-10-CM | POA: Diagnosis not present

## 2018-10-27 DIAGNOSIS — F1120 Opioid dependence, uncomplicated: Secondary | ICD-10-CM | POA: Diagnosis not present

## 2018-11-24 DIAGNOSIS — Z720 Tobacco use: Secondary | ICD-10-CM | POA: Diagnosis not present

## 2018-11-24 DIAGNOSIS — F1120 Opioid dependence, uncomplicated: Secondary | ICD-10-CM | POA: Diagnosis not present

## 2018-12-22 DIAGNOSIS — Z720 Tobacco use: Secondary | ICD-10-CM | POA: Diagnosis not present

## 2018-12-22 DIAGNOSIS — F1120 Opioid dependence, uncomplicated: Secondary | ICD-10-CM | POA: Diagnosis not present

## 2019-06-17 ENCOUNTER — Other Ambulatory Visit: Payer: Self-pay

## 2019-06-17 ENCOUNTER — Other Ambulatory Visit: Payer: Self-pay | Admitting: Internal Medicine

## 2019-06-17 DIAGNOSIS — Z20822 Contact with and (suspected) exposure to covid-19: Secondary | ICD-10-CM

## 2019-06-17 NOTE — Progress Notes (Unsigned)
[id number] 

## 2019-06-21 LAB — NOVEL CORONAVIRUS, NAA: SARS-CoV-2, NAA: NOT DETECTED

## 2020-10-29 ENCOUNTER — Other Ambulatory Visit: Payer: Self-pay

## 2020-10-29 ENCOUNTER — Ambulatory Visit
Admission: EM | Admit: 2020-10-29 | Discharge: 2020-10-29 | Disposition: A | Discharge status: Home / Self Care | Payer: 59 | Attending: Emergency Medicine | Admitting: Emergency Medicine

## 2020-10-29 ENCOUNTER — Encounter: Payer: Self-pay | Admitting: Emergency Medicine

## 2020-10-29 DIAGNOSIS — L239 Allergic contact dermatitis, unspecified cause: Secondary | ICD-10-CM

## 2020-10-29 DIAGNOSIS — R21 Rash and other nonspecific skin eruption: Secondary | ICD-10-CM

## 2020-10-29 MED ORDER — CETIRIZINE HCL 10 MG PO TABS: 10.0000 mg | ORAL_TABLET | Freq: Every day | ORAL | 0 refills | Status: AC

## 2020-10-29 MED ORDER — FLUCONAZOLE 200 MG PO TABS: ORAL_TABLET | ORAL | 0 refills | Status: AC

## 2020-10-29 MED ORDER — DOXYCYCLINE HYCLATE 100 MG PO CAPS: 100.0000 mg | ORAL_CAPSULE | Freq: Two times a day (BID) | ORAL | 0 refills | Status: DC

## 2020-10-29 MED ORDER — DEXAMETHASONE SODIUM PHOSPHATE 10 MG/ML IJ SOLN
10.0000 mg | Freq: Once | INTRAMUSCULAR | Status: AC
  Administered 2020-10-29: 10 mg via INTRAMUSCULAR

## 2020-10-29 MED ORDER — KETOCONAZOLE 200 MG PO TABS: 200.0000 mg | ORAL_TABLET | Freq: Every day | ORAL | 0 refills | Status: AC

## 2020-10-29 NOTE — Discharge Instructions (Addendum)
Continue to take prednisone pack as directed Zyrtec was prescribed Decadron IM was given in office Doxycycline was prescribed for rash on foot Ketoconazole cream was prescribed for rash and foot Diflucan was prescribed take as directed Take medication as directed Follow with PCP Return or go to ED if you develop any new or worsening of symptoms

## 2020-10-29 NOTE — ED Triage Notes (Signed)
Itchy rash to bilateral arms x 2 1/2 weeks. Pt believes he was having a reaction to fiberglass that was in his work gloves.  Pt has been on steroid shot.  Pt also has rash to to bilateral feet x 2 months. Pt has seen Dr and got steroid pack, abx and cream with no relief,

## 2020-10-29 NOTE — ED Provider Notes (Signed)
[facility]   [id number] 10/29/20 Arrival Time: 1553   Chief Complaint  Patient presents with  . Rash     SUBJECTIVE: History from: patient.  Jose Zuniga is a 34 y.o. male presented to the urgent care with a complaint of rash to bilateral arm for the past 2 and half weeks.  He also complaining of rash to bilateral feet for the past 2 months.  He denies changes in soaps, detergents, or anyone with similar symptoms.  He localizes the rash to her about the bilateral foot and arm.  Described the rash on bilateral hands as itchy and the one on bilateral foot as red, oozing and itching.  Has tried prednisone and Keflex for rash on bilateral foot without relief.  Denies aggravating factors.denies similar symptoms in the past, denies chills, fever, nausea, vomiting, diarrhea   ROS: As per HPI.  All other pertinent ROS negative.     History reviewed. No pertinent past medical history. Past Surgical History:  Procedure Laterality Date  . WISDOM TOOTH EXTRACTION     No Known Allergies No current facility-administered medications on file prior to encounter.   Current Outpatient Medications on File Prior to Encounter  Medication Sig Dispense Refill  . amphetamine-dextroamphetamine (ADDERALL XR) 10 MG 24 hr capsule Take 10 mg by mouth daily.    . buprenorphine-naloxone (SUBOXONE) 2-0.5 MG SUBL SL tablet Place 1 tablet under the tongue 2 (two) times daily.    Marland Kitchen ibuprofen (ADVIL,MOTRIN) 200 MG tablet Take 200-600 mg by mouth daily as needed for mild pain or moderate pain.    Marland Kitchen sertraline (ZOLOFT) 50 MG tablet Take 50 mg by mouth daily.     Social History   Socioeconomic History  . Marital status: Married    Spouse name: Not on file  . Number of children: Not on file  . Years of education: Not on file  . Highest education level: Not on file  Occupational History  . Not on file  Tobacco Use  . Smoking status: Current Every Day Smoker    Packs/day: 1.00    Years: 5.00    Pack  years: 5.00    Types: Cigarettes  . Smokeless tobacco: Never Used  Substance and Sexual Activity  . Alcohol use: Yes    Comment: occasional  . Drug use: No  . Sexual activity: Not on file  Other Topics Concern  . Not on file  Social History Narrative  . Not on file   Social Determinants of Health   Financial Resource Strain:   . Difficulty of Paying Living Expenses: Not on file  Food Insecurity:   . Worried About Programme researcher, broadcasting/film/video in the Last Year: Not on file  . Ran Out of Food in the Last Year: Not on file  Transportation Needs:   . Lack of Transportation (Medical): Not on file  . Lack of Transportation (Non-Medical): Not on file  Physical Activity:   . Days of Exercise per Week: Not on file  . Minutes of Exercise per Session: Not on file  Stress:   . Feeling of Stress : Not on file  Social Connections:   . Frequency of Communication with Friends and Family: Not on file  . Frequency of Social Gatherings with Friends and Family: Not on file  . Attends Religious Services: Not on file  . Active Member of Clubs or Organizations: Not on file  . Attends Banker Meetings: Not on file  . Marital Status: Not on  file  Intimate Partner Violence:   . Fear of Current or Ex-Partner: Not on file  . Emotionally Abused: Not on file  . Physically Abused: Not on file  . Sexually Abused: Not on file   No family history on file.  OBJECTIVE:  Vitals:   10/29/20 1609  BP: 140/75  Pulse: (!) 108  Resp: 19  Temp: 97.8 F (36.6 C)  TempSrc: Oral  SpO2: 97%     Physical Exam Vitals and nursing note reviewed.  Constitutional:      General: He is not in acute distress.    Appearance: Normal appearance. He is normal weight. He is not ill-appearing, toxic-appearing or diaphoretic.  Cardiovascular:     Rate and Rhythm: Normal rate and regular rhythm.     Pulses: Normal pulses.     Heart sounds: Normal heart sounds. No murmur heard.  No friction rub. No gallop.     Pulmonary:     Effort: Pulmonary effort is normal. No respiratory distress.     Breath sounds: Normal breath sounds. No stridor. No wheezing, rhonchi or rales.  Chest:     Chest wall: No tenderness.  Skin:    Findings: Rash present. Rash is macular.     Comments: Macular rash on bilateral hand and bilateral foot.  Rash on foot with clear discharge  Neurological:     Mental Status: He is alert and oriented to person, place, and time.     LABS:  No results found for this or any previous visit (from the past 24 hour(s)).   ASSESSMENT & PLAN:  1. Allergic dermatitis   2. Rash of foot     Meds ordered this encounter  Medications  . cetirizine (ZYRTEC ALLERGY) 10 MG tablet    Sig: Take 1 tablet (10 mg total) by mouth daily.    Dispense:  30 tablet    Refill:  0  . dexamethasone (DECADRON) injection 10 mg  . doxycycline (VIBRAMYCIN) 100 MG capsule    Sig: Take 1 capsule (100 mg total) by mouth 2 (two) times daily.    Dispense:  20 capsule    Refill:  0  . ketoconazole (NIZORAL) 200 MG tablet    Sig: Take 1 tablet (200 mg total) by mouth daily.    Dispense:  28 tablet    Refill:  0  . fluconazole (DIFLUCAN) 200 MG tablet    Sig: Take 1 tablet by mouth weekly    Dispense:  2 tablet    Refill:  0    Discharge instructions  Continue to take prednisone pack as directed Zyrtec was prescribed Decadron IM was given in office Doxycycline was prescribed for rash on foot Ketoconazole cream was prescribed for rash and foot Diflucan was prescribed take as directed Take medication as directed Follow with PCP Return or go to ED if you develop any new or worsening of symptoms  Reviewed expectations re: course of current medical issues. Questions answered. Outlined signs and symptoms indicating need for more acute intervention. Patient verbalized understanding. After Visit Summary given.         Durward Parcel, FNP 10/29/20 1644

## 2021-06-20 ENCOUNTER — Encounter: Payer: Self-pay | Admitting: Emergency Medicine

## 2021-06-20 ENCOUNTER — Other Ambulatory Visit: Payer: Self-pay

## 2021-06-20 ENCOUNTER — Ambulatory Visit
Admission: EM | Admit: 2021-06-20 | Discharge: 2021-06-20 | Disposition: A | Discharge status: Home / Self Care | Payer: BC Managed Care – PPO | Attending: Physician Assistant | Admitting: Physician Assistant

## 2021-06-20 DIAGNOSIS — K047 Periapical abscess without sinus: Secondary | ICD-10-CM

## 2021-06-20 MED ORDER — CLINDAMYCIN HCL 300 MG PO CAPS: 300.0000 mg | ORAL_CAPSULE | Freq: Three times a day (TID) | ORAL | 0 refills | Status: AC

## 2021-06-20 NOTE — Discharge Instructions (Addendum)
See your dentist for treatment  

## 2021-06-20 NOTE — ED Triage Notes (Signed)
Triaged by provider  

## 2021-06-21 NOTE — ED Provider Notes (Signed)
[facility]    CSN: [id number] Arrival date & time: 06/20/21  1520      History   Chief Complaint No chief complaint on file.   HPI Jose Zuniga is a 35 y.o. male.   The history is provided by the patient. No language interpreter was used.  Dental Pain Location:  Upper Upper teeth location:  1/RU 3rd molar Quality:  Localized Severity:  Moderate Onset quality:  Gradual Context: abscess   Relieved by:  Nothing Associated symptoms: facial pain   Pt reports his dentist is out of town.  Ptcomplains of swelling and pain  History reviewed. No pertinent past medical history.  There are no problems to display for this patient.   Past Surgical History:  Procedure Laterality Date   WISDOM TOOTH EXTRACTION         Home Medications    Prior to Admission medications   Medication Sig Start Date End Date Taking? Authorizing Provider  clindamycin (CLEOCIN) 300 MG capsule Take 1 capsule (300 mg total) by mouth 3 (three) times daily for 10 days. 06/20/21 06/30/21 Yes Elson Areas, PA-C  amphetamine-dextroamphetamine (ADDERALL XR) 10 MG 24 hr capsule Take 10 mg by mouth daily.    [provider]  buprenorphine-naloxone (SUBOXONE) 2-0.5 MG SUBL SL tablet Place 1 tablet under the tongue 2 (two) times daily.    [provider]  cetirizine (ZYRTEC ALLERGY) 10 MG tablet Take 1 tablet (10 mg total) by mouth daily. 10/29/20   Avegno, Zachery Dakins, FNP  doxycycline (VIBRAMYCIN) 100 MG capsule Take 1 capsule (100 mg total) by mouth 2 (two) times daily. 10/29/20   Avegno, Zachery Dakins, FNP  fluconazole (DIFLUCAN) 200 MG tablet Take 1 tablet by mouth weekly 10/29/20   Avegno, Zachery Dakins, FNP  ibuprofen (ADVIL,MOTRIN) 200 MG tablet Take 200-600 mg by mouth daily as needed for mild pain or moderate pain.    [provider]  ketoconazole (NIZORAL) 200 MG tablet Take 1 tablet (200 mg total) by mouth daily. 10/29/20   Avegno, Zachery Dakins, FNP  sertraline (ZOLOFT) 50 MG  tablet Take 50 mg by mouth daily.    [provider]    Family History No family history on file.  Social History Social History   Tobacco Use   Smoking status: Every Day    Packs/day: 1.00    Years: 5.00    Pack years: 5.00    Types: Cigarettes   Smokeless tobacco: Never  Substance Use Topics   Alcohol use: Yes    Comment: occasional   Drug use: No     Allergies   Patient has no known allergies.   Review of Systems Review of Systems  All other systems reviewed and are negative.   Physical Exam Triage Vital Signs ED Triage Vitals  Enc Vitals Group     BP 06/20/21 1639 134/76     Pulse Rate 06/20/21 1639 89     Resp 06/20/21 1639 16     Temp 06/20/21 1639 97.9 F (36.6 C)     Temp Source 06/20/21 1639 Tympanic     SpO2 06/20/21 1639 98 %     Weight --      Height --      Head Circumference --      Peak Flow --      Pain Score 06/20/21 1642 0     Pain Loc --      Pain Edu? --      Excl. in GC? --  No data found.  Updated Vital Signs BP 134/76 (BP Location: Right Arm)   Pulse 89   Temp 97.9 F (36.6 C) (Tympanic)   Resp 16   SpO2 98%   Visual Acuity Right Eye Distance:   Left Eye Distance:   Bilateral Distance:    Right Eye Near:   Left Eye Near:    Bilateral Near:     Physical Exam Vitals and nursing note reviewed.  Constitutional:      Appearance: He is well-developed.  HENT:     Head: Normocephalic and atraumatic.     Mouth/Throat:     Comments: Swollen face and upper gum  Eyes:     Conjunctiva/sclera: Conjunctivae normal.  Cardiovascular:     Rate and Rhythm: Normal rate.     Heart sounds: No murmur heard. Pulmonary:     Effort: Pulmonary effort is normal. No respiratory distress.  Musculoskeletal:     Cervical back: Neck supple.  Skin:    General: Skin is warm and dry.  Neurological:     General: No focal deficit present.     Mental Status: He is alert.     UC Treatments / Results  Labs (all labs ordered  are listed, but only abnormal results are displayed) Labs Reviewed - No data to display  EKG   Radiology No results found.  Procedures Procedures (including critical care time)  Medications Ordered in UC Medications - No data to display  Initial Impression / Assessment and Plan / UC Course  I have reviewed the triage vital signs and the nursing notes.  Pertinent labs & imaging results that were available during my care of the patient were reviewed by me and considered in my medical decision making (see chart for details).     MDM:  Pt advised to follow up with dentist.  Final Clinical Impressions(s) / UC Diagnoses   Final diagnoses:  Dental abscess     Discharge Instructions      See your dentist for treatment   ED Prescriptions     Medication Sig Dispense Auth. Provider   clindamycin (CLEOCIN) 300 MG capsule Take 1 capsule (300 mg total) by mouth 3 (three) times daily for 10 days. 30 capsule Elson Areas, [place]      PDMP not reviewed this encounter.   Elson Areas, [place] 06/21/21 2319

## 2021-08-01 ENCOUNTER — Ambulatory Visit
Admission: EM | Admit: 2021-08-01 | Discharge: 2021-08-01 | Disposition: A | Discharge status: Home / Self Care | Payer: BC Managed Care – PPO | Attending: Family Medicine | Admitting: Family Medicine

## 2021-08-01 ENCOUNTER — Encounter: Payer: Self-pay | Admitting: Emergency Medicine

## 2021-08-01 DIAGNOSIS — R509 Fever, unspecified: Secondary | ICD-10-CM

## 2021-08-01 DIAGNOSIS — B349 Viral infection, unspecified: Secondary | ICD-10-CM

## 2021-08-01 DIAGNOSIS — Z1152 Encounter for screening for COVID-19: Secondary | ICD-10-CM

## 2021-08-01 MED ORDER — ONDANSETRON HCL 4 MG PO TABS: 4.0000 mg | ORAL_TABLET | Freq: Four times a day (QID) | ORAL | 0 refills | Status: AC

## 2021-08-01 NOTE — ED Provider Notes (Signed)
[facility]    CSN: [id number] Arrival date & time: 08/01/21  1105      History   Chief Complaint Chief Complaint  Patient presents with   Generalized Body Aches   Nasal Congestion   Fever   Nausea    HPI Jose Zuniga is a 35 y.o. male.   HPI Patient presents with viral symptoms including body aches, low grade fever, nasal congestion, runny nose, and sinus pressure.  Onset of symptoms x 1 day. worrisome symptoms of shortness of breath, weakness, N&V, or chest pain. Request COVID test.  History reviewed. No pertinent past medical history.  There are no problems to display for this patient.   Past Surgical History:  Procedure Laterality Date   WISDOM TOOTH EXTRACTION         Home Medications    Prior to Admission medications   Medication Sig Start Date End Date Taking? Authorizing Provider  ondansetron (ZOFRAN) 4 MG tablet Take 1 tablet (4 mg total) by mouth every 6 (six) hours. 08/01/21  Yes Bing Neighbors, FNP  amphetamine-dextroamphetamine (ADDERALL XR) 10 MG 24 hr capsule Take 10 mg by mouth daily.    [provider]  buprenorphine-naloxone (SUBOXONE) 2-0.5 MG SUBL SL tablet Place 1 tablet under the tongue 2 (two) times daily.    [provider]  cetirizine (ZYRTEC ALLERGY) 10 MG tablet Take 1 tablet (10 mg total) by mouth daily. 10/29/20   Avegno, Zachery Dakins, FNP  doxycycline (VIBRAMYCIN) 100 MG capsule Take 1 capsule (100 mg total) by mouth 2 (two) times daily. 10/29/20   Avegno, Zachery Dakins, FNP  fluconazole (DIFLUCAN) 200 MG tablet Take 1 tablet by mouth weekly 10/29/20   Avegno, Zachery Dakins, FNP  ibuprofen (ADVIL,MOTRIN) 200 MG tablet Take 200-600 mg by mouth daily as needed for mild pain or moderate pain.    [provider]  ketoconazole (NIZORAL) 200 MG tablet Take 1 tablet (200 mg total) by mouth daily. 10/29/20   Avegno, Zachery Dakins, FNP  sertraline (ZOLOFT) 50 MG tablet Take 50 mg by mouth daily.    [provider]     Family History History reviewed. No pertinent family history.  Social History Social History   Tobacco Use   Smoking status: Every Day    Packs/day: 1.00    Years: 5.00    Pack years: 5.00    Types: Cigarettes   Smokeless tobacco: Never  Substance Use Topics   Alcohol use: Yes    Comment: occasional   Drug use: No     Allergies   Patient has no known allergies.   Review of Systems Review of Systems Pertinent negatives listed in HPI   Physical Exam Triage Vital Signs ED Triage Vitals [08/01/21 1237]  Enc Vitals Group     BP (!) 139/97     Pulse Rate 97     Resp 18     Temp 98.3 F (36.8 C)     Temp Source Oral     SpO2 97 %     Weight      Height      Head Circumference      Peak Flow      Pain Score      Pain Loc      Pain Edu?      Excl. in GC?    No data found.  Updated Vital Signs BP (!) 139/97 (BP Location: Right Arm)   Pulse 97   Temp 98.3 F (36.8 C) (  Oral)   Resp 18   SpO2 97%   Visual Acuity Right Eye Distance:   Left Eye Distance:   Bilateral Distance:    Right Eye Near:   Left Eye Near:    Bilateral Near:     Physical Exam  General Appearance:    Alert, acutely ill appearing, cooperative, no distress  HENT:   Normocephalic, ears normal, nares mucosal edema with congestion, rhinorrhea, oropharynx  clear  Eyes:    PERRL, conjunctiva/corneas clear, EOM's intact       Lungs:     Clear to auscultation bilaterally, respirations unlabored  Heart:    Regular rate and rhythm  Neurologic:   Awake, alert, oriented x 3. No apparent focal neurological           defect.      UC Treatments / Results  Labs (all labs ordered are listed, but only abnormal results are displayed) Labs Reviewed  NOVEL CORONAVIRUS, NAA    EKG   Radiology No results found.  Procedures Procedures (including critical care time)  Medications Ordered in UC Medications - No data to display  Initial Impression / Assessment and Plan / UC Course  I  have reviewed the triage vital signs and the nursing notes.  Pertinent labs & imaging results that were available during my care of the patient were reviewed by me and considered in my medical decision making (see chart for details).      COVID/Flu test pending. Symptom management warranted only.  Manage fever with Tylenol and ibuprofen.  Nasal symptoms with over-the-counter antihistamines recommended.  Treatment per discharge medications/discharge instructions.  Red flags/ER precautions given. The most current CDC isolation/quarantine recommendation advised.      Final diagnoses:  Fever, unspecified  Encounter for screening for COVID-19  Viral illness     Discharge Instructions      Your COVID 19 results should result within 2-4 days. Negative results are immediately resulted to Mychart. Positive results will receive a follow-up call from our clinic. If symptoms are present, I recommend home quarantine until results are known.  Alternate Tylenol and ibuprofen as needed for body aches and fever.  Symptom management per recommendations discussed today.  If any breathing difficulty or chest pain develops go immediately to the closest emergency department for evaluation.    ED Prescriptions     Medication Sig Dispense Auth. Provider   ondansetron (ZOFRAN) 4 MG tablet Take 1 tablet (4 mg total) by mouth every 6 (six) hours. 12 tablet Bing Neighbors, FNP      PDMP not reviewed this encounter.   Bing Neighbors, [place] 08/09/21 [phone]

## 2021-08-01 NOTE — ED Triage Notes (Signed)
Pt presents today with c/o of nasal congestion, body aches, nausea and low grade fever x 07/30/21. He requests Covid test.

## 2021-08-01 NOTE — Discharge Instructions (Signed)
Your COVID 19 results should result within 2-4 days. Negative results are immediately resulted to Mychart. Positive results will receive a follow-up call from our clinic. If symptoms are present, I recommend home quarantine until results are known.  Alternate Tylenol and ibuprofen as needed for body aches and fever.  Symptom management per recommendations discussed today.  If any breathing difficulty or chest pain develops go immediately to the closest emergency department for evaluation.  

## 2021-08-02 LAB — NOVEL CORONAVIRUS, NAA: SARS-CoV-2, NAA: DETECTED — AB

## 2021-08-02 LAB — SARS-COV-2, NAA 2 DAY TAT

## 2021-09-15 ENCOUNTER — Ambulatory Visit
Admission: EM | Admit: 2021-09-15 | Discharge: 2021-09-15 | Disposition: A | Discharge status: Home / Self Care | Payer: BC Managed Care – PPO | Attending: Emergency Medicine | Admitting: Emergency Medicine

## 2021-09-15 ENCOUNTER — Encounter: Payer: Self-pay | Admitting: Emergency Medicine

## 2021-09-15 ENCOUNTER — Other Ambulatory Visit: Payer: Self-pay

## 2021-09-15 DIAGNOSIS — J020 Streptococcal pharyngitis: Secondary | ICD-10-CM | POA: Diagnosis present

## 2021-09-15 DIAGNOSIS — J029 Acute pharyngitis, unspecified: Secondary | ICD-10-CM | POA: Diagnosis present

## 2021-09-15 DIAGNOSIS — J069 Acute upper respiratory infection, unspecified: Secondary | ICD-10-CM | POA: Diagnosis present

## 2021-09-15 LAB — POCT RAPID STREP A (OFFICE): Rapid Strep A Screen: NEGATIVE

## 2021-09-15 MED ORDER — LIDOCAINE VISCOUS HCL 2 % MT SOLN
15.0000 mL | OROMUCOSAL | 0 refills | Status: AC | PRN
Start: 2021-09-15 — End: ?

## 2021-09-15 NOTE — ED Provider Notes (Signed)
[facility]   [id number] 09/15/21 Arrival Time: 1350  YC:XKGY THROAT  SUBJECTIVE: History from: patient.  Jose Zuniga is a 35 y.o. male who presents with fever, and sore throat x 5 days  Admits to sick exposure to bronchitis.  Has tried OTC medication without relief.  Symptoms are made worse with swallowing, but tolerating liquids and own secretions without difficulty.  Reports previous symptoms in the past.   Denies fever, chills, SOB, wheezing, chest pain, nausea, rash, changes in bowel or bladder habits.     ROS: As per HPI.  All other pertinent ROS negative.     History reviewed. No pertinent past medical history. Past Surgical History:  Procedure Laterality Date   WISDOM TOOTH EXTRACTION     No Known Allergies No current facility-administered medications on file prior to encounter.   Current Outpatient Medications on File Prior to Encounter  Medication Sig Dispense Refill   amphetamine-dextroamphetamine (ADDERALL XR) 10 MG 24 hr capsule Take 10 mg by mouth daily.     buprenorphine-naloxone (SUBOXONE) 2-0.5 MG SUBL SL tablet Place 1 tablet under the tongue 2 (two) times daily.     cetirizine (ZYRTEC ALLERGY) 10 MG tablet Take 1 tablet (10 mg total) by mouth daily. 30 tablet 0   fluconazole (DIFLUCAN) 200 MG tablet Take 1 tablet by mouth weekly 2 tablet 0   ibuprofen (ADVIL,MOTRIN) 200 MG tablet Take 200-600 mg by mouth daily as needed for mild pain or moderate pain.     ketoconazole (NIZORAL) 200 MG tablet Take 1 tablet (200 mg total) by mouth daily. 28 tablet 0   ondansetron (ZOFRAN) 4 MG tablet Take 1 tablet (4 mg total) by mouth every 6 (six) hours. 12 tablet 0   sertraline (ZOLOFT) 50 MG tablet Take 50 mg by mouth daily.     Social History   Socioeconomic History   Marital status: Married    Spouse name: Not on file   Number of children: Not on file   Years of education: Not on file   Highest education level: Not on file  Occupational History   Not on file   Tobacco Use   Smoking status: Every Day    Packs/day: 1.00    Years: 5.00    Pack years: 5.00    Types: Cigarettes   Smokeless tobacco: Never  Substance and Sexual Activity   Alcohol use: Yes    Comment: occasional   Drug use: No   Sexual activity: Not on file  Other Topics Concern   Not on file  Social History Narrative   Not on file   Social Determinants of Health   Financial Resource Strain: Not on file  Food Insecurity: Not on file  Transportation Needs: Not on file  Physical Activity: Not on file  Stress: Not on file  Social Connections: Not on file  Intimate Partner Violence: Not on file   History reviewed. No pertinent family history.  OBJECTIVE:  Vitals:   09/15/21 1355  BP: (!) 151/81  Pulse: 93  Resp: 18  Temp: 97.7 F (36.5 C)  TempSrc: Oral  SpO2: 97%    General appearance: alert; well-appearing, nontoxic; speaking in full sentences and tolerating own secretions HEENT: NCAT; Ears: EACs clear, TMs pearly gray; Eyes: PERRL.  EOM grossly intact.Nose: nares patent without rhinorrhea, Throat: oropharynx clear, tonsils non erythematous or enlarged, uvula midline  Neck: supple without LAD Lungs: unlabored respirations, symmetrical air entry; cough: absent; no respiratory distress; CTAB Heart: regular rate and rhythm.  Skin: warm and dry Psychological: alert and cooperative; normal mood and affect   LABS: Results for orders placed or performed during the hospital encounter of 09/15/21 (from the past 24 hour(s))  POCT rapid strep A     Status: None   Collection Time: 09/15/21  2:05 PM  Result Value Ref Range   Rapid Strep A Screen Negative Negative     ASSESSMENT & PLAN:  1. Viral pharyngitis   2. Streptococcal sore throat   3. Sore throat   4. Viral URI with cough     Meds ordered this encounter  Medications   lidocaine (XYLOCAINE) 2 % solution    Sig: Use as directed 15 mLs in the mouth or throat as needed for mouth pain (Do NOT exceed 8  doses in a 24 hour period).    Dispense:  100 mL    Refill:  0    Order Specific Question:   Supervising Provider    Answer:   Eustace Moore [[id number]]     Strep test negative, will send out for culture and we will call you with results Get plenty of rest and push fluids Viscous lidocaine prescribed.  This is an oral solution you can swish, and gargle as needed for symptomatic relief of sore throat.  Do not exceed 8 doses in a 24 hour period.  Do not use prior to eating, as this will numb your entire mouth.   Drink warm or cool liquids, use throat lozenges, or popsicles to help alleviate symptoms Take OTC ibuprofen or tylenol as needed for pain Follow up with PCP if symptoms persists Return or go to ER if patient has any new or worsening symptoms such as fever, chills, nausea, vomiting, worsening sore throat, cough, abdominal pain, chest pain, changes in bowel or bladder habits, etc...  Reviewed expectations re: course of current medical issues. Questions answered. Outlined signs and symptoms indicating need for more acute intervention. Patient verbalized understanding. After Visit Summary given.         [name], PA-C 09/15/21 1440

## 2021-09-15 NOTE — Discharge Instructions (Signed)
Strep test negative, will send out for culture and we will call you with results °Get plenty of rest and push fluids °Viscous lidocaine prescribed.  This is an oral solution you can swish, and gargle as needed for symptomatic relief of sore throat.  Do not exceed 8 doses in a 24 hour period.  Do not use prior to eating, as this will numb your entire mouth.   °Drink warm or cool liquids, use throat lozenges, or popsicles to help alleviate symptoms °Take OTC ibuprofen or tylenol as needed for pain °Follow up with PCP if symptoms persists °Return or go to ER if patient has any new or worsening symptoms such as fever, chills, nausea, vomiting, worsening sore throat, cough, abdominal pain, chest pain, changes in bowel or bladder habits, etc... °

## 2021-09-15 NOTE — ED Triage Notes (Signed)
Sore throat since Monday with headaches off and on.

## 2021-09-18 LAB — CULTURE, GROUP A STREP (THRC)

## 2022-08-08 ENCOUNTER — Ambulatory Visit
Admission: EM | Admit: 2022-08-08 | Discharge: 2022-08-08 | Disposition: A | Discharge status: Home / Self Care | Payer: BC Managed Care – PPO | Attending: Family Medicine | Admitting: Family Medicine

## 2022-08-08 DIAGNOSIS — R300 Dysuria: Secondary | ICD-10-CM

## 2022-08-08 LAB — POCT URINALYSIS DIP (MANUAL ENTRY)
Blood, UA: NEGATIVE
Glucose, UA: NEGATIVE mg/dL
Leukocytes, UA: NEGATIVE
Nitrite, UA: POSITIVE — AB
Protein Ur, POC: 30 mg/dL — AB
Spec Grav, UA: 1.025 (ref 1.010–1.025)
Urobilinogen, UA: 1 E.U./dL
pH, UA: 6 (ref 5.0–8.0)

## 2022-08-08 MED ORDER — CEPHALEXIN 500 MG PO CAPS: 500.0000 mg | ORAL_CAPSULE | Freq: Two times a day (BID) | ORAL | 0 refills | Status: AC

## 2022-08-08 NOTE — ED Triage Notes (Addendum)
Pt present rash with frequently urination. Symptoms started on Friday.  Pt took an AZO for relief.

## 2022-08-09 LAB — CYTOLOGY, (ORAL, ANAL, URETHRAL) ANCILLARY ONLY
Chlamydia: NEGATIVE
Comment: NEGATIVE
Comment: NEGATIVE
Comment: NORMAL
Neisseria Gonorrhea: NEGATIVE
Trichomonas: NEGATIVE

## 2022-08-09 LAB — URINE CULTURE: Culture: NO GROWTH

## 2022-08-09 NOTE — ED Provider Notes (Signed)
  [facility]    ASSESSMENT & PLAN:  1. Dysuria    Urine culture pending.  Meds ordered this encounter  Medications   cephALEXin (KEFLEX) 500 MG capsule    Sig: Take 1 capsule (500 mg total) by mouth 2 (two) times daily.    Dispense:  14 capsule    Refill:  0   He is not concerned over STI but agrees to send urethral cytology. No signs of pyelonephritis. Urine culture sent. Will follow up with his PCP or here if not showing improvement over the next 48 hours, sooner if needed.  Outlined signs and symptoms indicating need for more acute intervention. Patient verbalized understanding. After Visit Summary given.  SUBJECTIVE:  Jose Zuniga is a 36 y.o. male who complains of urinary frequency; abrupt onset; noted 3-4 d ago. Stable now. No hematuria. No penile discharge. Is sexually active. Afebrile. No abd pain.  OBJECTIVE:  Vitals:   08/08/22 1450  BP: (!) 164/92  Pulse: 83  Resp: 18  Temp: 99 F (37.2 C)  TempSrc: Oral  SpO2: 96%   General appearance: alert; no distress HENT: oropharynx: moist Lungs: unlabored respirations Abdomen: soft, non-tender; bowel sounds normal; no masses or organomegaly; no guarding or rebound tenderness Back: no CVA tenderness Extremities: no edema; symmetrical with no gross deformities GU: deferred Skin: warm and dry Neurologic: normal gait Psychological: alert and cooperative; normal mood and affect  Labs Reviewed  POCT URINALYSIS DIP (MANUAL ENTRY) - Abnormal; Notable for the following components:      Result Value   Color, UA straw (*)    Bilirubin, UA small (*)    Ketones, POC UA trace (5) (*)    Protein Ur, POC =30 (*)    Nitrite, UA Positive (*)    All other components within normal limits  URINE CULTURE  CYTOLOGY, (ORAL, ANAL, URETHRAL) ANCILLARY ONLY    No Known Allergies  History reviewed. No pertinent past medical history. Social History   Socioeconomic History   Marital status: Married    Spouse  name: Not on file   Number of children: Not on file   Years of education: Not on file   Highest education level: Not on file  Occupational History   Not on file  Tobacco Use   Smoking status: Every Day    Packs/day: 1.00    Years: 5.00    Total pack years: 5.00    Types: Cigarettes   Smokeless tobacco: Never  Substance and Sexual Activity   Alcohol use: Yes    Comment: occasional   Drug use: No   Sexual activity: Not on file  Other Topics Concern   Not on file  Social History Narrative   Not on file   Social Determinants of Health   Financial Resource Strain: Not on file  Food Insecurity: Not on file  Transportation Needs: Not on file  Physical Activity: Not on file  Stress: Not on file  Social Connections: Not on file  Intimate Partner Violence: Not on file   History reviewed. No pertinent family history.      Mardella Layman, MD 08/09/22 [phone]

## 2022-08-15 ENCOUNTER — Emergency Department (HOSPITAL_COMMUNITY): Payer: BC Managed Care – PPO

## 2022-08-15 ENCOUNTER — Emergency Department (HOSPITAL_COMMUNITY)
Admission: EM | Admit: 2022-08-15 | Discharge: 2022-08-15 | Disposition: A | Discharge status: Home / Self Care | Payer: BC Managed Care – PPO | Attending: Emergency Medicine | Admitting: Emergency Medicine

## 2022-08-15 ENCOUNTER — Encounter (HOSPITAL_COMMUNITY): Payer: Self-pay | Admitting: Emergency Medicine

## 2022-08-15 ENCOUNTER — Other Ambulatory Visit: Payer: Self-pay

## 2022-08-15 DIAGNOSIS — R1013 Epigastric pain: Secondary | ICD-10-CM | POA: Insufficient documentation

## 2022-08-15 DIAGNOSIS — R109 Unspecified abdominal pain: Secondary | ICD-10-CM | POA: Diagnosis present

## 2022-08-15 LAB — COMPREHENSIVE METABOLIC PANEL
ALT: 20 U/L (ref 0–44)
AST: 20 U/L (ref 15–41)
Albumin: 4.5 g/dL (ref 3.5–5.0)
Alkaline Phosphatase: 54 U/L (ref 38–126)
Anion gap: 7 (ref 5–15)
BUN: 15 mg/dL (ref 6–20)
CO2: 29 mmol/L (ref 22–32)
Calcium: 9.3 mg/dL (ref 8.9–10.3)
Chloride: 103 mmol/L (ref 98–111)
Creatinine, Ser: 1.1 mg/dL (ref 0.61–1.24)
GFR, Estimated: >60 mL/min (ref >60)
Glucose, Bld: 143 mg/dL — ABNORMAL HIGH (ref 70–99)
Potassium: 4.2 mmol/L (ref 3.5–5.1)
Sodium: 139 mmol/L (ref 135–145)
Total Bilirubin: 1.3 mg/dL — ABNORMAL HIGH (ref 0.3–1.2)
Total Protein: 7.4 g/dL (ref 6.5–8.1)

## 2022-08-15 LAB — CBC
HCT: 46.5 % (ref 39.0–52.0)
Hemoglobin: 15.7 g/dL (ref 13.0–17.0)
MCH: 31.8 pg (ref 26.0–34.0)
MCHC: 33.8 g/dL (ref 30.0–36.0)
MCV: 94.3 fL (ref 80.0–100.0)
Platelets: 226 10*3/uL (ref 150–400)
RBC: 4.93 MIL/uL (ref 4.22–5.81)
RDW: 11.6 % (ref 11.5–15.5)
WBC: 8.5 10*3/uL (ref 4.0–10.5)
nRBC: 0 % (ref 0.0–0.2)

## 2022-08-15 LAB — URINALYSIS, ROUTINE W REFLEX MICROSCOPIC
Bilirubin Urine: NEGATIVE
Glucose, UA: NEGATIVE mg/dL
Hgb urine dipstick: NEGATIVE
Ketones, ur: NEGATIVE mg/dL
Leukocytes,Ua: NEGATIVE
Nitrite: NEGATIVE
Protein, ur: NEGATIVE mg/dL
Specific Gravity, Urine: 1.018 (ref 1.005–1.030)
pH: 7 (ref 5.0–8.0)

## 2022-08-15 LAB — LIPASE, BLOOD: Lipase: 27 U/L (ref 11–51)

## 2022-08-15 MED ORDER — IOHEXOL 300 MG/ML  SOLN
100.0000 mL | Freq: Once | INTRAMUSCULAR | Status: AC | PRN
  Administered 2022-08-15: 100 mL via INTRAVENOUS

## 2022-08-15 MED ORDER — PANTOPRAZOLE SODIUM 40 MG PO TBEC: 40.0000 mg | DELAYED_RELEASE_TABLET | Freq: Every day | ORAL | 0 refills | Status: AC

## 2022-08-15 NOTE — Discharge Instructions (Signed)
Please take the pantoprazole as directed daily.  Bland diet as tolerated.  Avoid alcohol, spicy or greasy foods.  Call the GI provider listed to arrange follow-up appointment.  Return emergency department for any new or worsening symptoms.

## 2022-08-15 NOTE — ED Triage Notes (Addendum)
Pt c/o left abd pain radiating into left flank and left back x one week. Denies frequent urination. States was "confused" 6 days ago.

## 2022-08-17 NOTE — ED Provider Notes (Signed)
[facility] EMERGENCY DEPARTMENT Provider Note   CSN: [id number] Arrival date & time: 08/15/22  1006     History  Chief Complaint  Patient presents with   Abdominal Pain    Jose Zuniga is a 36 y.o. male.   Abdominal Pain Associated symptoms: no chest pain, no chills, no constipation, no diarrhea, no fever, no nausea, no shortness of breath and no vomiting        Jose Zuniga is a 36 y.o. male who presents to the Emergency Department complaining of mid upper abdominal pain and left flank pain.  Symptoms present for 1 week.  Gradual in onset.  Initially, he noticed pain of his left flank, no known injury.  Pain did not appear to radiate to his groin.  He denies any urinary symptoms or hematuria.  States that he "did not feel quite right" when symptoms initially began.  Pain sometimes affected by food intake.  He does endorse daily alcohol use.  He denies any nausea, vomiting, fever or chills.  No history of kidney stones or pancreatitis.  Home Medications Prior to Admission medications   Medication Sig Start Date End Date Taking? Authorizing Provider  pantoprazole (PROTONIX) 40 MG tablet Take 1 tablet (40 mg total) by mouth daily. Before breakfast 08/15/22  Yes Shakiyah Cirilo, PA-C  amphetamine-dextroamphetamine (ADDERALL XR) 10 MG 24 hr capsule Take 10 mg by mouth daily.    [provider]  buprenorphine-naloxone (SUBOXONE) 2-0.5 MG SUBL SL tablet Place 1 tablet under the tongue 2 (two) times daily.    [provider]  cephALEXin (KEFLEX) 500 MG capsule Take 1 capsule (500 mg total) by mouth 2 (two) times daily. 08/08/22   Mardella Layman, MD  cetirizine (ZYRTEC ALLERGY) 10 MG tablet Take 1 tablet (10 mg total) by mouth daily. 10/29/20   Avegno, Zachery Dakins, FNP  fluconazole (DIFLUCAN) 200 MG tablet Take 1 tablet by mouth weekly 10/29/20   Avegno, Zachery Dakins, FNP  ibuprofen (ADVIL,MOTRIN) 200 MG tablet Take 200-600 mg by mouth daily as needed for mild pain or moderate pain.     [provider]  ketoconazole (NIZORAL) 200 MG tablet Take 1 tablet (200 mg total) by mouth daily. 10/29/20   Avegno, Zachery Dakins, FNP  lidocaine (XYLOCAINE) 2 % solution Use as directed 15 mLs in the mouth or throat as needed for mouth pain (Do NOT exceed 8 doses in a 24 hour period). 09/15/21   Wurst, [place], PA-C  ondansetron (ZOFRAN) 4 MG tablet Take 1 tablet (4 mg total) by mouth every 6 (six) hours. 08/01/21   Bing Neighbors, FNP  sertraline (ZOLOFT) 50 MG tablet Take 50 mg by mouth daily.    [provider]      Allergies    Patient has no known allergies.    Review of Systems   Review of Systems  Constitutional:  Negative for appetite change, chills and fever.  Respiratory:  Negative for shortness of breath.   Cardiovascular:  Negative for chest pain.  Gastrointestinal:  Positive for abdominal pain. Negative for blood in stool, constipation, diarrhea, nausea and vomiting.  Genitourinary:  Positive for flank pain. Negative for decreased urine volume, difficulty urinating, penile swelling, scrotal swelling and testicular pain.  Musculoskeletal:  Positive for back pain.  Skin:  Negative for color change and rash.  Neurological:  Negative for dizziness, weakness and numbness.    Physical Exam Updated Vital Signs BP (!) 140/80 (BP Location: Right Arm)   Pulse 90   Temp  98.1 F (36.7 C) (Oral)   Resp 16   SpO2 100%  Physical Exam Vitals and nursing note reviewed.  Constitutional:      General: He is not in acute distress.    Appearance: He is well-developed. He is not ill-appearing or toxic-appearing.  HENT:     Mouth/Throat:     Mouth: Mucous membranes are moist.  Cardiovascular:     Rate and Rhythm: Normal rate and regular rhythm.  Pulmonary:     Effort: Pulmonary effort is normal.     Breath sounds: Normal breath sounds.  Abdominal:     General: Bowel sounds are normal.     Palpations: Abdomen is soft.     Tenderness: There is abdominal  tenderness in the epigastric area. There is no right CVA tenderness or left CVA tenderness.  Musculoskeletal:        General: No tenderness.  Skin:    General: Skin is warm.     Findings: No rash.  Neurological:     General: No focal deficit present.     Mental Status: He is alert.     Sensory: No sensory deficit.     Motor: No weakness.     ED Results / Procedures / Treatments   Labs (all labs ordered are listed, but only abnormal results are displayed) Labs Reviewed  COMPREHENSIVE METABOLIC PANEL - Abnormal; Notable for the following components:      Result Value   Glucose, Bld 143 (*)    Total Bilirubin 1.3 (*)    All other components within normal limits  CBC  URINALYSIS, ROUTINE W REFLEX MICROSCOPIC  LIPASE, BLOOD    EKG None  Radiology US Abdomen Limited  Result Date: 08/15/2022 CLINICAL DATA:  Epigastric pain EXAM: ULTRASOUND ABDOMEN LIMITED RIGHT UPPER QUADRANT COMPARISON:  None Available. FINDINGS: Gallbladder: There is a 4 mm gallbladder polyp for which no specific imaging follow-up is required. There are no shadowing stones. There is no gallbladder wall thickening or pericholecystic fluid. Sonographic Murphy's sign was reported negative. Common bile duct: Diameter: 2 mm.  There is no intrahepatic biliary ductal dilatation. Liver: Background parenchymal echogenicity is mildly increased. There is an ill-defined hypoechoic area in the right hepatic lobe which may reflect geographic fatty sparing as no correlate finding is seen on the same-day CT. Portal vein is patent on color Doppler imaging with normal direction of blood flow towards the liver. Other: None. IMPRESSION: 1. No acute finding in the right upper quadrant. 2. Probable mild fatty infiltration of the liver with a probable small area of geographic fatty sparing in the right hepatic lobe. Electronically Signed   By: Lesia Hausen M.D.   On: 08/15/2022 14:31   CT ABDOMEN PELVIS W CONTRAST  Result Date:  08/15/2022 CLINICAL DATA:  Abdominal pain, acute, nonlocalized. Infrequent alcohol use. EXAM: CT ABDOMEN AND PELVIS WITH CONTRAST TECHNIQUE: Multidetector CT imaging of the abdomen and pelvis was performed using the standard protocol following bolus administration of intravenous contrast. RADIATION DOSE REDUCTION: This exam was performed according to the departmental dose-optimization program which includes automated exposure control, adjustment of the mA and/or kV according to patient size and/or use of iterative reconstruction technique. CONTRAST:  OMNIPAQUE IOHEXOL 300 MG/ML  SOLN COMPARISON:  None FINDINGS: Lower chest: Lung bases are clear.  No pleural or pericardial fluid. Hepatobiliary: Liver parenchyma is normal. No evidence of fatty change. No focal lesion. No calcified gallstones or ductal dilatation. Pancreas: Normal Spleen: Normal Adrenals/Urinary Tract: Adrenal glands are normal. Kidneys  are normal. Bladder is normal. Stomach/Bowel: Stomach and small intestine are normal. Normal appendix. No colon abnormality is seen. Vascular/Lymphatic: Normal Reproductive: Normal Other: None Musculoskeletal: Normal IMPRESSION: Normal CT scan of the abdomen and pelvis. No abnormality seen to explain pain. Electronically Signed   By: Paulina Fusi M.D.   On: 08/15/2022 12:45     Procedures Procedures    Medications Ordered in ED Medications  iohexol (OMNIPAQUE) 300 MG/ML solution 100 mL (100 mLs Intravenous Contrast Given 08/15/22 1235)    ED Course/ Medical Decision Making/ A&P                           Medical Decision Making Patient here for evaluation of epigastric pain for nearly 1 week.  Pain began in the left flank.  Pain was nonradiating.  No history of hematuria or urinary symptoms.  No prior kidney stones.  On exam, patient well-appearing nontoxic.  Vital signs are reassuring.  He does endorse a history of daily alcohol use.  Differential diagnosis would include but not limited to  pancreatitis, cholelithiasis, cholecystitis, GERD peptic ulcer disease  Amount and/or Complexity of Data Reviewed Labs: ordered.    Details: Labs interpreted by me, no evidence of leukocytosis, chemistries essentially unremarkable, lipase reassuring, urinalysis without hematuria or evidence of infection. Radiology: ordered.    Details: CT abdomen and pelvis obtained for further evaluation of patient's symptoms, results show normal CT appearance of abdomen and pelvis.  Ultrasound right upper quadrant obtained for further evaluation of possible gallbladder disease, no evidence of acute findings of the right upper quadrant Discussion of management or test interpretation with external provider(s): On recheck, patient resting comfortably.  I suspect that his epigastric pain is related to GERD/PUD.  We will start patient on PPI, he will be given outpatient follow-up information for GI as he may need EGD for further evaluation if his symptoms are not improving.  He is agreeable to plan, appears appropriate for discharge home, all questions answered.  Risk Prescription drug management.           Final Clinical Impression(s) / ED Diagnoses Final diagnoses:  Epigastric pain    Rx / DC Orders ED Discharge Orders          Ordered    pantoprazole (PROTONIX) 40 MG tablet  Daily        08/15/22 1539              Pauline Aus, PA-C 08/17/22 1507    Bethann Berkshire, MD 08/17/22 1719

## 2023-05-08 ENCOUNTER — Ambulatory Visit
Admission: EM | Admit: 2023-05-08 | Discharge: 2023-05-08 | Disposition: A | Discharge status: Home / Self Care | Payer: BC Managed Care – PPO | Attending: Nurse Practitioner | Admitting: Nurse Practitioner

## 2023-05-08 DIAGNOSIS — R21 Rash and other nonspecific skin eruption: Secondary | ICD-10-CM

## 2023-05-08 DIAGNOSIS — L03114 Cellulitis of left upper limb: Secondary | ICD-10-CM | POA: Diagnosis not present

## 2023-05-08 MED ORDER — DOXYCYCLINE HYCLATE 100 MG PO TABS: 100.0000 mg | ORAL_TABLET | Freq: Two times a day (BID) | ORAL | 0 refills | Status: AC

## 2023-05-08 MED ORDER — PREDNISONE 20 MG PO TABS: 40.0000 mg | ORAL_TABLET | Freq: Every day | ORAL | 0 refills | Status: AC

## 2023-05-08 MED ORDER — TRIAMCINOLONE ACETONIDE 0.1 % EX CREA: 1.0000 | TOPICAL_CREAM | Freq: Two times a day (BID) | CUTANEOUS | 0 refills | Status: AC

## 2023-05-08 MED ORDER — DEXAMETHASONE SODIUM PHOSPHATE 10 MG/ML IJ SOLN
10.0000 mg | INTRAMUSCULAR | Status: AC
  Administered 2023-05-08: 10 mg via INTRAMUSCULAR

## 2023-05-08 NOTE — ED Triage Notes (Signed)
Pt c/o pt states he is unsure what is on his arm but it is swollen and has spread to the right arm pt states the arm had blisters yesterday,

## 2023-05-08 NOTE — Discharge Instructions (Signed)
Take medication as prescribed. May also take over-the-counter Zyrtec during the day and Benadryl at bedtime to help with itching. Avoid hot baths or showers while symptoms persist.  Recommend taking lukewarm baths. May apply cool cloths to the area to help with itching or discomfort. Avoid scratching, rubbing, or manipulating the areas while symptoms persist. Recommend Aveeno colloidal oatmeal bath to use to help with drying and itching. If you notice increased redness, drainage, or swelling to the areas, please follow-up in this clinic for further evaluation. Follow-up as needed.

## 2023-05-08 NOTE — ED Provider Notes (Signed)
[facility]    CSN: [id number] Arrival date & time: 05/08/23  1320      History   Chief Complaint No chief complaint on file.   HPI Hickman Emswiler is a 37 y.o. male.   The history is provided by the patient.   The patient presents for complaints of rash with redness, itching, and blistering that is been present for the past several days.  Patient states that the rash is located on both his forearms.  He states that the rash on the left forearm had 3 "dark spots" yesterday, he states that they look like they were filled with blood.  Patient states that the areas on his arms are itchy.  He denies fever, chills, chest pain, abdominal pain, nausea, vomiting, diarrhea, bruising, or drainage.  Patient states that he does work outside a lot, and believes this may be poison ivy.  Patient states he has been using topical anti-itch creams for his symptoms.  History reviewed. No pertinent past medical history.  There are no problems to display for this patient.   Past Surgical History:  Procedure Laterality Date   WISDOM TOOTH EXTRACTION         Home Medications    Prior to Admission medications   Medication Sig Start Date End Date Taking? Authorizing Provider  amphetamine-dextroamphetamine (ADDERALL XR) 10 MG 24 hr capsule Take 10 mg by mouth daily.    [provider]  buprenorphine-naloxone (SUBOXONE) 2-0.5 MG SUBL SL tablet Place 1 tablet under the tongue 2 (two) times daily.    [provider]  cephALEXin (KEFLEX) 500 MG capsule Take 1 capsule (500 mg total) by mouth 2 (two) times daily. 08/08/22   Mardella Layman, MD  cetirizine (ZYRTEC ALLERGY) 10 MG tablet Take 1 tablet (10 mg total) by mouth daily. 10/29/20   Avegno, Zachery Dakins, FNP  fluconazole (DIFLUCAN) 200 MG tablet Take 1 tablet by mouth weekly 10/29/20   Avegno, Zachery Dakins, FNP  ibuprofen (ADVIL,MOTRIN) 200 MG tablet Take 200-600 mg by mouth daily as needed for mild pain or moderate pain.     [provider]  ketoconazole (NIZORAL) 200 MG tablet Take 1 tablet (200 mg total) by mouth daily. 10/29/20   Avegno, Zachery Dakins, FNP  lidocaine (XYLOCAINE) 2 % solution Use as directed 15 mLs in the mouth or throat as needed for mouth pain (Do NOT exceed 8 doses in a 24 hour period). 09/15/21   Wurst, [place], PA-C  ondansetron (ZOFRAN) 4 MG tablet Take 1 tablet (4 mg total) by mouth every 6 (six) hours. 08/01/21   Bing Neighbors, NP  pantoprazole (PROTONIX) 40 MG tablet Take 1 tablet (40 mg total) by mouth daily. Before breakfast 08/15/22   Triplett, Tammy, PA-C  sertraline (ZOLOFT) 50 MG tablet Take 50 mg by mouth daily.    [provider]    Family History History reviewed. No pertinent family history.  Social History Social History   Tobacco Use   Smoking status: Every Day    Packs/day: 1.00    Years: 5.00    Additional pack years: 0.00    Total pack years: 5.00    Types: Cigarettes   Smokeless tobacco: Never  Substance Use Topics   Alcohol use: Yes    Comment: occasional   Drug use: No     Allergies   Patient has no known allergies.   Review of Systems Review of Systems Per HPI  Physical Exam Triage Vital Signs ED Triage Vitals  Enc Vitals Group     BP 05/08/23 1324 (!) 148/82     Pulse Rate 05/08/23 1324 98     Resp 05/08/23 1324 16     Temp 05/08/23 1324 98.1 F (36.7 C)     Temp Source 05/08/23 1324 Oral     SpO2 05/08/23 1324 98 %     Weight --      Height --      Head Circumference --      Peak Flow --      Pain Score 05/08/23 1328 0     Pain Loc --      Pain Edu? --      Excl. in GC? --    No data found.  Updated Vital Signs BP (!) 148/82 (BP Location: Right Arm)   Pulse 98   Temp 98.1 F (36.7 C) (Oral)   Resp 16   SpO2 98%   Visual Acuity Right Eye Distance:   Left Eye Distance:   Bilateral Distance:    Right Eye Near:   Left Eye Near:    Bilateral Near:     Physical Exam Vitals and nursing note reviewed.   Constitutional:      General: He is not in acute distress.    Appearance: Normal appearance.  HENT:     Head: Normocephalic.  Eyes:     Extraocular Movements: Extraocular movements intact.     Pupils: Pupils are equal, round, and reactive to light.  Cardiovascular:     Rate and Rhythm: Normal rate and regular rhythm.     Pulses: Normal pulses.     Heart sounds: Normal heart sounds.  Pulmonary:     Effort: Pulmonary effort is normal. No respiratory distress.     Breath sounds: Normal breath sounds. No stridor. No wheezing, rhonchi or rales.  Abdominal:     General: Bowel sounds are normal.     Palpations: Abdomen is soft.  Musculoskeletal:     Cervical back: Normal range of motion.  Skin:    General: Skin is warm and dry.     Findings: Rash present. Rash is macular and papular.     Comments: Maculopapular rash noted to the left anterior forearm, medial forearm, and right anterior forearm.  Rash is annular in presentation.  Rash to the left forearm with 3 "dark" areas, that appear to be consistent with eschar.  Areas are erythematous, and mildly warm to palpation.  Rash with well-defined borders.  Neurological:     General: No focal deficit present.     Mental Status: He is alert and oriented to person, place, and time.  Psychiatric:        Mood and Affect: Mood normal.        Behavior: Behavior normal.      UC Treatments / Results  Labs (all labs ordered are listed, but only abnormal results are displayed) Labs Reviewed - No data to display  EKG   Radiology No results found.  Procedures Procedures (including critical care time)  Medications Ordered in UC Medications  dexamethasone (DECADRON) injection 10 mg (has no administration in time range)    Initial Impression / Assessment and Plan / UC Course  I have reviewed the triage vital signs and the nursing notes.  Pertinent labs & imaging results that were available during my care of the patient were reviewed by  me and considered in my medical decision making (see chart for details).  The patient is well-appearing, he is in  no acute distress, vital signs are stable.  Will treat patient for possible cellulitis of the left forearm with doxycycline 100 mg twice daily for 5 days.  For the underlying rash, will also treat with prednisone 40 mg for 5 days along with triamcinolone cream 0.1%.  Supportive care recommendations were provided and discussed with the patient to include use of Zyrtec and Benadryl for itching, cool compresses, and avoidance of scratching the areas while symptoms persist.  Patient was given strict follow-up precautions.  Patient is in agreement with this plan of care and verbalizes understanding.  All questions were answered.  Patient stable for discharge.  Work note was provided.   Final Clinical Impressions(s) / UC Diagnoses   Final diagnoses:  None   Discharge Instructions   None    ED Prescriptions   None    PDMP not reviewed this encounter.   Abran Cantor, NP 05/08/23 1355
